# Patient Record
Sex: Female | Born: 1975
Health system: Southern US, Community
[De-identification: ages and names within clinical notes are randomized; demographics above are authoritative.]

## PROBLEM LIST (undated history)

## (undated) DIAGNOSIS — G610 Guillain-Barre syndrome: Secondary | ICD-10-CM

## (undated) DIAGNOSIS — M25559 Pain in unspecified hip: Secondary | ICD-10-CM

## (undated) DIAGNOSIS — E538 Deficiency of other specified B group vitamins: Secondary | ICD-10-CM

## (undated) DIAGNOSIS — E669 Obesity, unspecified: Secondary | ICD-10-CM

## (undated) DIAGNOSIS — M25569 Pain in unspecified knee: Secondary | ICD-10-CM

## (undated) DIAGNOSIS — D539 Nutritional anemia, unspecified: Secondary | ICD-10-CM

## (undated) HISTORY — DX: Pain in unspecified knee: M25.569

## (undated) HISTORY — DX: Pain in unspecified hip: M25.559

## (undated) HISTORY — DX: Obesity, unspecified: E66.9

## (undated) HISTORY — PX: WISDOM TOOTH EXTRACTION: SHX21

---

## 2013-06-21 ENCOUNTER — Emergency Department (INDEPENDENT_AMBULATORY_CARE_PROVIDER_SITE_OTHER)
Admission: EM | Admit: 2013-06-21 | Discharge: 2013-06-21 | Disposition: A | Discharge status: Home / Self Care | Payer: Self-pay | Source: Home / Self Care | Attending: Family Medicine | Admitting: Family Medicine

## 2013-06-21 ENCOUNTER — Encounter (HOSPITAL_COMMUNITY): Payer: Self-pay | Admitting: Emergency Medicine

## 2013-06-21 DIAGNOSIS — L250 Unspecified contact dermatitis due to cosmetics: Secondary | ICD-10-CM

## 2013-06-21 MED ORDER — METHYLPREDNISOLONE ACETATE 80 MG/ML IJ SUSP
INTRAMUSCULAR | Status: AC
  Filled 2013-06-21: qty 1

## 2013-06-21 MED ORDER — TRIAMCINOLONE ACETONIDE 40 MG/ML IJ SUSP
INTRAMUSCULAR | Status: AC
  Filled 2013-06-21: qty 1

## 2013-06-21 MED ORDER — METHYLPREDNISOLONE ACETATE 40 MG/ML IJ SUSP
80.0000 mg | Freq: Once | INTRAMUSCULAR | Status: AC
  Administered 2013-06-21: 80 mg via INTRAMUSCULAR

## 2013-06-21 MED ORDER — TRIAMCINOLONE ACETONIDE 40 MG/ML IJ SUSP
40.0000 mg | Freq: Once | INTRAMUSCULAR | Status: AC
Start: 2013-06-21 — End: 2013-06-21
  Administered 2013-06-21: 40 mg via INTRAMUSCULAR

## 2013-06-21 MED ORDER — FLUTICASONE PROPIONATE 0.05 % EX CREA: TOPICAL_CREAM | Freq: Two times a day (BID) | CUTANEOUS | Status: DC

## 2013-06-21 NOTE — ED Provider Notes (Signed)
CSN: [id number]     Arrival date & time 06/21/13  1800 History   First MD Initiated Contact with Patient 06/21/13 1917     Chief Complaint  Patient presents with  . lips burning    (Consider location/radiation/quality/duration/timing/severity/associated sxs/prior Treatment) Patient is a 38 y.o. female presenting with rash. The history is provided by the patient.  Rash Location:  Face Facial rash location:  Lip Quality: blistering and burning   Severity:  Mild Onset quality:  Gradual Duration:  1 week Progression:  Unchanged Chronicity:  New Relieved by:  None tried Worsened by:  Nothing tried Ineffective treatments:  None tried Associated symptoms: no headaches   Associated symptoms comment:  Burning discomfort in back and legs, no weakness.   History reviewed. No pertinent past medical history. History reviewed. No pertinent past surgical history. No family history on file. History  Substance Use Topics  . Smoking status: Not on file  . Smokeless tobacco: Not on file  . Alcohol Use: Not on file   OB History   Grav Para Term Preterm Abortions TAB SAB Ect Mult Living                 Review of Systems  Constitutional: Negative.   Gastrointestinal: Negative.   Genitourinary: Negative.   Skin: Positive for rash.  Neurological: Negative for facial asymmetry, weakness, numbness and headaches.  Psychiatric/Behavioral: The patient is nervous/anxious.     Allergies  Review of patient's allergies indicates no known allergies.  Home Medications   Current Outpatient Rx  Name  Route  Sig  Dispense  Refill  . fluticasone (CUTIVATE) 0.05 % cream   Topical   Apply topically 2 (two) times daily.   30 g   0    BP 127/83  Pulse 93  Temp(Src) 98.9 F (37.2 C) (Oral)  Resp 16  SpO2 100% Physical Exam  Nursing note and vitals reviewed. Constitutional: She is oriented to person, place, and time. She appears well-developed and well-nourished.  Musculoskeletal: Normal  range of motion.  Neurological: She is alert and oriented to person, place, and time.  Skin: Skin is warm and dry. Rash noted.  Dermatitis of upper and lower lips, lower ext nl nvt fxn.    ED Course  Procedures (including critical care time) Labs Review Labs Reviewed - No data to display Imaging Review No results found.   MDM   1. Contact dermatitis due to cosmetics        Linna HoffJames D Kadra Kohan, MD 06/21/13 [phone]

## 2013-06-21 NOTE — ED Notes (Signed)
Patient states her lips and both lower legs have been  "burning" for the past week States she feels this burning sensation in her lower back as well

## 2013-06-21 NOTE — ED Notes (Signed)
Also complains of sore throat that started yesterday

## 2013-06-21 NOTE — Discharge Instructions (Signed)
Change brands of lipstick , use medicine as prescribed, see your doctor if further problems and for regular care.

## 2013-06-24 ENCOUNTER — Telehealth (HOSPITAL_COMMUNITY): Payer: Self-pay | Admitting: *Deleted

## 2013-06-24 NOTE — ED Notes (Signed)
Pt. called on VM on 3/20 and asked what were the 2 shots she had.  I called back on 3/20 but no answer. Call 1.  I called back today and left a message to call. Call 2.  Pt. called back and verified x 2 and told that she got Depo Medrol and Kenalog, that both were steroids, one was short acting and one is long acting. She said they helped her and she has a f/u appt. Vassie MoselleYork, Macalister Arnaud M 06/24/2013

## 2013-06-25 ENCOUNTER — Emergency Department (HOSPITAL_BASED_OUTPATIENT_CLINIC_OR_DEPARTMENT_OTHER)
Admission: EM | Admit: 2013-06-25 | Discharge: 2013-06-25 | Disposition: A | Discharge status: Home / Self Care | Payer: BC Managed Care – PPO | Attending: Emergency Medicine | Admitting: Emergency Medicine

## 2013-06-25 ENCOUNTER — Emergency Department (HOSPITAL_BASED_OUTPATIENT_CLINIC_OR_DEPARTMENT_OTHER): Payer: BC Managed Care – PPO

## 2013-06-25 ENCOUNTER — Encounter (HOSPITAL_BASED_OUTPATIENT_CLINIC_OR_DEPARTMENT_OTHER): Payer: Self-pay | Admitting: Emergency Medicine

## 2013-06-25 DIAGNOSIS — R0789 Other chest pain: Secondary | ICD-10-CM

## 2013-06-25 DIAGNOSIS — IMO0002 Reserved for concepts with insufficient information to code with codable children: Secondary | ICD-10-CM | POA: Insufficient documentation

## 2013-06-25 DIAGNOSIS — X58XXXA Exposure to other specified factors, initial encounter: Secondary | ICD-10-CM | POA: Insufficient documentation

## 2013-06-25 DIAGNOSIS — R42 Dizziness and giddiness: Secondary | ICD-10-CM | POA: Insufficient documentation

## 2013-06-25 DIAGNOSIS — Y9389 Activity, other specified: Secondary | ICD-10-CM | POA: Insufficient documentation

## 2013-06-25 DIAGNOSIS — Z87891 Personal history of nicotine dependence: Secondary | ICD-10-CM | POA: Insufficient documentation

## 2013-06-25 DIAGNOSIS — Y92410 Unspecified street and highway as the place of occurrence of the external cause: Secondary | ICD-10-CM | POA: Insufficient documentation

## 2013-06-25 DIAGNOSIS — S29011A Strain of muscle and tendon of front wall of thorax, initial encounter: Secondary | ICD-10-CM

## 2013-06-25 DIAGNOSIS — S2341XA Sprain of ribs, initial encounter: Secondary | ICD-10-CM | POA: Insufficient documentation

## 2013-06-25 MED ORDER — HYDROCODONE-ACETAMINOPHEN 5-325 MG PO TABS: 1.0000 | ORAL_TABLET | Freq: Four times a day (QID) | ORAL | Status: DC | PRN

## 2013-06-25 MED ORDER — CYCLOBENZAPRINE HCL 5 MG PO TABS: 5.0000 mg | ORAL_TABLET | Freq: Three times a day (TID) | ORAL | Status: DC | PRN

## 2013-06-25 NOTE — ED Notes (Signed)
Pt transported to radiology in stretcher by tech.

## 2013-06-25 NOTE — ED Provider Notes (Signed)
CSN: [id number]     Arrival date & time 06/25/13  1247 History   First MD Initiated Contact with Patient 06/25/13 1302     Chief Complaint  Patient presents with  . Chest Pain   HPI Rebecca Goodman is 38 y.o. female that presented to the ED with complaints chest wall pain under her left breast, axillary and shoulder while she was driving about an hour ago. She states her left shoulder started to become numb. The pain was a 4/10 when it started. She has never experienced chest pain before. Pain now is 1/10. Patient reports she did feel lightheaded. She denies nausea, vomit or diarrhea or abdominal pain. She doesn't admit to burning in her extremities that seems to worsen as the day proceeds. Patient states that she has stress in her daily life but no more than normal. She's currently on no medications finishing up a steroid pack last week. She has no history of diabetes. No cardiac history. Patient reports Saturday she was lifting a heavy TV on her own.  History reviewed. No pertinent past medical history. History reviewed. No pertinent past surgical history. No family history on file. History  Substance Use Topics  . Smoking status: Former Games developer  . Smokeless tobacco: Not on file  . Alcohol Use: No   OB History   Grav Para Term Preterm Abortions TAB SAB Ect Mult Living                 Review of Systems  Constitutional: Negative for fever, diaphoresis, activity change, appetite change and fatigue.  Respiratory: Negative for cough, chest tightness, shortness of breath and wheezing.   Cardiovascular: Positive for chest pain. Negative for palpitations.  Genitourinary: Negative for flank pain.  Musculoskeletal: Negative for back pain and myalgias.  Skin: Negative for rash.  Neurological: Positive for dizziness. Negative for tremors, seizures, syncope, speech difficulty and weakness.   Allergies  Review of patient's allergies indicates no known allergies.  Home Medications   Current  Outpatient Rx  Name  Route  Sig  Dispense  Refill  . cyclobenzaprine (FLEXERIL) 5 MG tablet   Oral   Take 1 tablet (5 mg total) by mouth 3 (three) times daily as needed for muscle spasms.   15 tablet   0   . fluticasone (CUTIVATE) 0.05 % cream   Topical   Apply topically 2 (two) times daily.   30 g   0   . HYDROcodone-acetaminophen (NORCO) 5-325 MG per tablet   Oral   Take 1 tablet by mouth every 6 (six) hours as needed for moderate pain.   15 tablet   0    BP 131/76  Pulse 78  Temp(Src) 98.6 F (37 C) (Oral)  Resp 18  Ht 5\' 3"  (1.6 m)  Wt 244 lb (110.678 kg)  BMI 43.23 kg/m2  SpO2 100%  LMP 06/11/2013 Physical Exam Gen: NAD. Nontoxic HEENT: AT. [address].  Bilateral eyes without injections or icterus. MMM.  CV: RRR, no murmurs, clicks, gallops or rubs. Chest: CTAB, no wheeze or crackles. Chest wall tenderness left lateral axillary. Abd: Soft. NTND. BS present. No Masses palpated.  Ext: No erythema. No edema.  Skin: No rashes, purpura or petechiae.  Neuro:  Normal gait. PERLA. EOMi. Alert. Grossly intact.  Psych: Normal affect, dress and demeanor. Normal speech.  ED Course  Procedures (including critical care time) Labs Review Labs Reviewed - No data to display Imaging Review Dg Chest 2 View  06/25/2013   CLINICAL DATA:  Chest pain, shortness of Breath  EXAM: CHEST  2 VIEW  COMPARISON:  None.  FINDINGS: Cardiomediastinal silhouette is unremarkable. No acute infiltrate or pleural effusion. No pulmonary edema. Bony thorax is unremarkable.  IMPRESSION: No active cardiopulmonary disease.   Electronically Signed   By: Natasha MeadLiviu  Pop M.D.   On: 06/25/2013 14:06     EKG Interpretation   Date/Time:  Monday June 25 2013 12:52:35 EDT Ventricular Rate:  78 PR Interval:  156 QRS Duration: 78 QT Interval:  366 QTC Calculation: 417 R Axis:   42 Text Interpretation:  Normal sinus rhythm with sinus arrhythmia Normal ECG  Confirmed by DELOS  MD, DOUGLAS ([id number]) on 06/25/2013 1:23:04  PM      MDM   Final diagnoses:  Muscle strain of chest wall  Non-cardiac chest pain   She presented the ED chest wall pain times one hour. Patient had improvement in pain without intervention. Chest x-ray was normal. EKG was normal. Patient's pain likely due from muscle strain or aggravation from lifting heavy objects over the weekend. Patient was given Flexeril and Norco prescription #15. Patient was advised to signed PCP followup. Red flags were discussed with patient on his symptoms worsen to return to ED    Natalia Leatherwoodenee A Kuneff, DO 06/25/13 1504

## 2013-06-25 NOTE — Discharge Instructions (Signed)

## 2013-06-25 NOTE — ED Notes (Signed)
Pt reports has been seen by pcp 3 days ago for 'burning' sensation in bilateral hands. Today was driving and with sudden pain in left lower rib pain and numbness down left arm.  Denies sob, sweating or nausea.  Reports was lightheaded and dizzy.

## 2013-06-25 NOTE — ED Notes (Signed)
Patient changed into gown from waist up. 

## 2013-06-26 ENCOUNTER — Ambulatory Visit (INDEPENDENT_AMBULATORY_CARE_PROVIDER_SITE_OTHER): Payer: Self-pay | Admitting: Family Medicine

## 2013-06-26 VITALS — BP 116/82 | HR 88 | Ht 63.0 in | Wt 244.0 lb

## 2013-06-26 DIAGNOSIS — R202 Paresthesia of skin: Principal | ICD-10-CM

## 2013-06-26 DIAGNOSIS — R200 Anesthesia of skin: Secondary | ICD-10-CM

## 2013-06-26 DIAGNOSIS — R209 Unspecified disturbances of skin sensation: Secondary | ICD-10-CM

## 2013-06-26 NOTE — ED Provider Notes (Signed)
I saw and evaluated the patient, reviewed the resident's note and I agree with the findings and plan. Patient is a 38 year old female who presents with complaints of discomfort to the left upper chest which started approximately one hour prior to presentation. She denies any injury or trauma. She denies any difficulty breathing or recent cough. There is no nausea, diaphoresis. She has no prior cardiac history.  On exam vitals are stable and the patient is afebrile. Head is atraumatic, normocephalic. Heart is regular rate and rhythm and lungs are clear. Palpation of the left upper chest wall reproduces her discomfort. Abdomen is soft, nontender, nondistended. Extremities are without edema.  Workup reveals a normal EKG and chest x-ray is unremarkable. Her symptoms are reproducible with palpation of the chest wall and I strongly doubt any cardiac etiology. She has no risk factors and her symptoms aren atypical for cardiac pain. She will be discharged with pain medication and when necessary followup.   EKG Interpretation   Date/Time:  Monday June 25 2013 12:52:35 EDT Ventricular Rate:  78 PR Interval:  156 QRS Duration: 78 QT Interval:  366 QTC Calculation: 417 R Axis:   42 Text Interpretation:  Normal sinus rhythm with sinus arrhythmia Normal ECG  Confirmed by Malva CoganELOS  MD, Gershon Shorten ([id number]) on 06/25/2013 1:23:04 PM        Geoffery Lyonsouglas Talyn Dessert, MD 06/26/13 [id number]

## 2013-07-02 ENCOUNTER — Encounter: Payer: Self-pay | Admitting: Family Medicine

## 2013-07-02 DIAGNOSIS — R200 Anesthesia of skin: Secondary | ICD-10-CM | POA: Insufficient documentation

## 2013-07-02 DIAGNOSIS — R202 Paresthesia of skin: Principal | ICD-10-CM

## 2013-07-02 NOTE — Progress Notes (Signed)
Patient ID: Rebecca Goodman, female   DOB: 10/20/1975, 38 y.o.   MRN: [id number]  PCP: No PCP Per Patient  Subjective:   HPI: Patient is a 38 y.o. female here for numbness/tingling.  Patient denies known injury. She states on 3/3 she started to develop a burning sensation when at work. Tingling in both her legs and her lips that worsened and by Friday her face and legs were 'on fire' No new medications, soaps, lotions. She went to Urgent care and was given an IM injection of a steroid and topical steroid cream for lips. These have not helped. Initially had similar symptoms in both arms but this has improved. She had some chest tightness and left arm numbness so had workup in ED that was negative. Unfortunately does not have insurance at this time. No speech, vision changes.  History reviewed. No pertinent past medical history.  Current Outpatient Prescriptions on File Prior to Visit  Medication Sig Dispense Refill  . cyclobenzaprine (FLEXERIL) 5 MG tablet Take 1 tablet (5 mg total) by mouth 3 (three) times daily as needed for muscle spasms.  15 tablet  0  . fluticasone (CUTIVATE) 0.05 % cream Apply topically 2 (two) times daily.  30 g  0  . HYDROcodone-acetaminophen (NORCO) 5-325 MG per tablet Take 1 tablet by mouth every 6 (six) hours as needed for moderate pain.  15 tablet  0   No current facility-administered medications on file prior to visit.    History reviewed. No pertinent past surgical history.  No Known Allergies  History   Social History  . Marital Status: Single    Spouse Name: N/A    Number of Children: N/A  . Years of Education: N/A   Occupational History  . Not on file.   Social History Main Topics  . Smoking status: Former Games developermoker  . Smokeless tobacco: Not on file  . Alcohol Use: No  . Drug Use: No  . Sexual Activity: Not on file   Other Topics Concern  . Not on file   Social History Narrative  . No narrative on file    Family History  Problem  Relation Age of Onset  . Hypertension Father   . Sudden death Neg Hx   . Diabetes Neg Hx   . Heart attack Neg Hx   . Hyperlipidemia Neg Hx     BP 116/82  Pulse 88  Ht 5\' 3"  (1.6 m)  Wt 244 lb (110.678 kg)  BMI 43.23 kg/m2  LMP 06/11/2013  Review of Systems: See HPI above.    Objective:  Physical Exam:  Gen: NAD  Neck: No gross deformity, swelling, bruising. No paraspinal TTP .  No midline/bony TTP. FROM neck without pain . BUE strength 5/5.   Sensation intact to light touch.   2+ equal reflexes in triceps, biceps, brachioradialis tendons. Negative spurlings. NV intact distal BUEs.  Back: No gross deformity, scoliosis. No TTP.  No midline or bony TTP. FROM. Strength LEs 5/5 all muscle groups.  2+ MSRs in patellar and achilles tendons, equal bilaterally. Negative SLRs. Sensation intact to light touch bilaterally. Negative logroll bilateral hips    Assessment & Plan:  1. Numbness/Burning in extremities and lips - I do not believe this is an orthopedic/sports medicine issue.  Not consistent with an urgent issue like a stroke but she needs to see neurology for evaluation.  Was given [employer] coverage paperwork to fill out and call us as soon as this goes through so we can do  neurology referral.

## 2013-07-02 NOTE — Assessment & Plan Note (Signed)
I do not believe this is an orthopedic/sports medicine issue. Not consistent with an urgent issue like a stroke but she needs to see neurology for evaluation. Was given [employer] coverage paperwork to fill out and call us as soon as this goes through so we can do neurology referral.

## 2013-07-16 ENCOUNTER — Emergency Department (HOSPITAL_BASED_OUTPATIENT_CLINIC_OR_DEPARTMENT_OTHER): Payer: BC Managed Care – PPO

## 2013-07-16 ENCOUNTER — Encounter (HOSPITAL_BASED_OUTPATIENT_CLINIC_OR_DEPARTMENT_OTHER): Payer: Self-pay | Admitting: Emergency Medicine

## 2013-07-16 ENCOUNTER — Emergency Department (HOSPITAL_BASED_OUTPATIENT_CLINIC_OR_DEPARTMENT_OTHER)
Admission: EM | Admit: 2013-07-16 | Discharge: 2013-07-16 | Disposition: A | Discharge status: Home / Self Care | Payer: BC Managed Care – PPO | Attending: Emergency Medicine | Admitting: Emergency Medicine

## 2013-07-16 DIAGNOSIS — M549 Dorsalgia, unspecified: Secondary | ICD-10-CM

## 2013-07-16 DIAGNOSIS — Z87891 Personal history of nicotine dependence: Secondary | ICD-10-CM | POA: Insufficient documentation

## 2013-07-16 DIAGNOSIS — Z3202 Encounter for pregnancy test, result negative: Secondary | ICD-10-CM | POA: Insufficient documentation

## 2013-07-16 DIAGNOSIS — IMO0002 Reserved for concepts with insufficient information to code with codable children: Secondary | ICD-10-CM | POA: Insufficient documentation

## 2013-07-16 DIAGNOSIS — M5410 Radiculopathy, site unspecified: Secondary | ICD-10-CM

## 2013-07-16 LAB — URINALYSIS, ROUTINE W REFLEX MICROSCOPIC
Bilirubin Urine: NEGATIVE
GLUCOSE, UA: NEGATIVE mg/dL
Hgb urine dipstick: NEGATIVE
Ketones, ur: NEGATIVE mg/dL
LEUKOCYTES UA: NEGATIVE
Nitrite: NEGATIVE
Protein, ur: NEGATIVE mg/dL
Specific Gravity, Urine: 1.005 (ref 1.005–1.030)
Urobilinogen, UA: 0.2 mg/dL (ref 0.0–1.0)
pH: 6 (ref 5.0–8.0)

## 2013-07-16 LAB — PREGNANCY, URINE: PREG TEST UR: NEGATIVE

## 2013-07-16 MED ORDER — PREDNISONE 10 MG PO TABS: 20.0000 mg | ORAL_TABLET | Freq: Every day | ORAL | Status: DC

## 2013-07-16 MED ORDER — PREDNISONE 50 MG PO TABS
60.0000 mg | ORAL_TABLET | Freq: Once | ORAL | Status: DC
  Filled 2013-07-16 (×2): qty 1

## 2013-07-16 MED ORDER — CYCLOBENZAPRINE HCL 10 MG PO TABS: 10.0000 mg | ORAL_TABLET | Freq: Three times a day (TID) | ORAL | Status: DC | PRN

## 2013-07-16 NOTE — Discharge Instructions (Signed)
Radicular Pain Radicular pain in either the arm or leg is usually from a bulging or herniated disk in the spine. A piece of the herniated disk may press against the nerves as the nerves exit the spine. This causes pain which is felt at the tips of the nerves down the arm or leg. Other causes of radicular pain may include:  Fractures.  Heart disease.  Cancer.  An abnormal and usually degenerative state of the nervous system or nerves (neuropathy). Diagnosis may require CT or MRI scanning to determine the primary cause.  Nerves that start at the neck (nerve roots) may cause radicular pain in the outer shoulder and arm. It can spread down to the thumb and fingers. The symptoms vary depending on which nerve root has been affected. In most cases radicular pain improves with conservative treatment. Neck problems may require physical therapy, a neck collar, or cervical traction. Treatment may take many weeks, and surgery may be considered if the symptoms do not improve.  Conservative treatment is also recommended for sciatica. Sciatica causes pain to radiate from the lower back or buttock area down the leg into the foot. Often there is a history of back problems. Most patients with sciatica are better after 2 to 4 weeks of rest and other supportive care. Short term bed rest can reduce the disk pressure considerably. Sitting, however, is not a good position since this increases the pressure on the disk. You should avoid bending, lifting, and all other activities which make the problem worse. Traction can be used in severe cases. Surgery is usually reserved for patients who do not improve within the first months of treatment. Only take over-the-counter or prescription medicines for pain, discomfort, or fever as directed by your caregiver. Narcotics and muscle relaxants may help by relieving more severe pain and spasm and by providing mild sedation. Cold or massage can give significant relief. Spinal manipulation  is not recommended. It can increase the degree of disc protrusion. Epidural steroid injections are often effective treatment for radicular pain. These injections deliver medicine to the spinal nerve in the space between the protective covering of the spinal cord and back bones (vertebrae). Your caregiver can give you more information about steroid injections. These injections are most effective when given within two weeks of the onset of pain.  You should see your caregiver for follow up care as recommended. A program for neck and back injury rehabilitation with stretching and strengthening exercises is an important part of management.  SEEK IMMEDIATE MEDICAL CARE IF:  You develop increased pain, weakness, or numbness in your arm or leg.  You develop difficulty with bladder or bowel control.  You develop abdominal pain. Document Released: 04/29/2004 Document Revised: 06/14/2011 Document Reviewed: 07/15/2008 [employer] Patient Information 2014 [employer].  

## 2013-07-16 NOTE — ED Notes (Signed)
Patient states she developed bilateral leg pain three days ago.  States she developed lower back pain three weeks ago.  States she has pain in the bilateral calf pain, left > right.  States the left leg hurts over the entire leg, and the right leg from the posterior knee to the foot.  States her feet feel numb.  Denies any kind of injury.  States she had an IUD to spontaneously come out two days after her back started to hurt and feels that that is the reason for her pain.

## 2013-07-16 NOTE — ED Provider Notes (Addendum)
CSN: [id number]     Arrival date & time 07/16/13  0849 History   First MD Initiated Contact with Patient 07/16/13 [phone]     Chief Complaint  Patient presents with  . Leg Pain     (Consider location/radiation/quality/duration/timing/severity/associated sxs/prior Treatment) HPI  Patient presents today complaining of pain in her left medial eye that began several days ago. She states it occurs with standing. She is also experienced some low back pain. She has over the past 6 weeks had an episode where she felt numb from her knees down to her feet bilaterally with the perioral paresthesias. She was seen for this at urgent care and thought to have an allergic reaction. She states after this began she began having some back pain. This resolved spontaneously. She had an episode where she was concerned that she might have contracted an STD and was seen at the [facility] department in early March and reports that she was tested for STDs and has not had any discharge or problems since then. She was also seen for an episode of left chest pain and shoulder pain that was felt to be noncardiac in origin. The intermittent numbness from the knees down has waxed and waned. It has no exacerbating or relieving factors. She has not noted any weakness in either foot, knee, or hip. She has not had any loss of bowel or bladder control. She has had no known trauma. She had an IUD which came out spontaneously during the past 4 weeks.  During the past 6 weeks, when these things have occurred, she has established care with a primary care physician. She was seen and had her thyroid screen and had an ultrasound of her thyroid that was reported to her as normal. She states that she hasn't told her symptoms and is concerned that the left leg may have some more swelling than the right and that she has noted some varicose veins.  History reviewed. No pertinent past medical history. History reviewed. No pertinent past surgical  history. Family History  Problem Relation Age of Onset  . Hypertension Father   . Sudden death Neg Hx   . Diabetes Neg Hx   . Heart attack Neg Hx   . Hyperlipidemia Neg Hx    History  Substance Use Topics  . Smoking status: Former Games developermoker  . Smokeless tobacco: Not on file  . Alcohol Use: No   OB History   Grav Para Term Preterm Abortions TAB SAB Ect Mult Living                 Review of Systems  Constitutional: Negative.   HENT: Negative.   Eyes: Negative.   Respiratory: Negative.   Cardiovascular: Negative.   Gastrointestinal: Negative.   Endocrine: Negative.   Genitourinary: Negative.   Musculoskeletal: Positive for back pain.  Skin: Negative.   Neurological: Negative.   Hematological: Negative.   Psychiatric/Behavioral: Negative.   All other systems reviewed and are negative.     Allergies  Review of patient's allergies indicates no known allergies.  Home Medications   Current Outpatient Rx  Name  Route  Sig  Dispense  Refill  . amoxicillin (AMOXIL) 250 MG capsule   Oral   Take 250 mg by mouth 3 (three) times daily.         . cyclobenzaprine (FLEXERIL) 5 MG tablet   Oral   Take 1 tablet (5 mg total) by mouth 3 (three) times daily as needed for muscle spasms.   15  tablet   0   . fluticasone (CUTIVATE) 0.05 % cream   Topical   Apply topically 2 (two) times daily.   30 g   0   . HYDROcodone-acetaminophen (NORCO) 5-325 MG per tablet   Oral   Take 1 tablet by mouth every 6 (six) hours as needed for moderate pain.   15 tablet   0    BP 155/81  Pulse 94  Temp(Src) 98.4 F (36.9 C) (Oral)  Resp 18  Ht 5\' 3"  (1.6 m)  Wt 240 lb (108.863 kg)  BMI 42.52 kg/m2  SpO2 98%  LMP 06/30/2013 Physical Exam  Nursing note and vitals reviewed. Constitutional: She is oriented to person, place, and time. She appears well-developed and well-nourished.  HENT:  Head: Normocephalic and atraumatic.  Right Ear: Tympanic membrane and external ear normal.  Left  Ear: Tympanic membrane and external ear normal.  Nose: Nose normal. Right sinus exhibits no maxillary sinus tenderness and no frontal sinus tenderness. Left sinus exhibits no maxillary sinus tenderness and no frontal sinus tenderness.  Eyes: Conjunctivae and EOM are normal. Pupils are equal, round, and reactive to light. Right eye exhibits no nystagmus. Left eye exhibits no nystagmus.  Neck: Normal range of motion. Neck supple.  Cardiovascular: Normal rate, regular rhythm, normal heart sounds and intact distal pulses.   Pulmonary/Chest: Effort normal and breath sounds normal. No respiratory distress. She exhibits no tenderness.  Abdominal: Soft. Bowel sounds are normal. She exhibits no distension and no mass. There is no tenderness.  Musculoskeletal: Normal range of motion. She exhibits no edema and no tenderness.  Neurological: She is alert and oriented to person, place, and time. She has normal strength and normal reflexes. No sensory deficit. She displays a negative Romberg sign. GCS eye subscore is 4. GCS verbal subscore is 5. GCS motor subscore is 6.  Reflex Scores:      Tricep reflexes are 2+ on the right side and 2+ on the left side.      Bicep reflexes are 2+ on the right side and 2+ on the left side.      Brachioradialis reflexes are 2+ on the right side and 2+ on the left side.      Patellar reflexes are 2+ on the right side and 2+ on the left side.      Achilles reflexes are 2+ on the right side and 2+ on the left side. Patient with normal gait without ataxia, shuffling, spasm, or antalgia. Speech is normal without dysarthria, dysphasia, or aphasia. Muscle strength is 5/5 in bilateral shoulders, elbow flexor and extensors, wrist flexor and extensors, and intrinsic hand muscles. Sensation normal bilateral lower extremities to sharp and light touch.  5/5 bilateral lower extremity hip flexors, extensors, knee flexors and extensors, and ankle dorsi and plantar flexors.    Skin: Skin is  warm and dry. No rash noted.  Psychiatric: She has a normal mood and affect. Her behavior is normal. Judgment and thought content normal.    ED Course  Procedures (including critical care time) Labs Review Labs Reviewed  URINALYSIS, ROUTINE W REFLEX MICROSCOPIC  PREGNANCY, URINE   Imaging Review US Venous Img Lower Bilateral  07/16/2013   CLINICAL DATA:  Three day history of bilateral lower leg and back pain.  EXAM: BILATERAL LOWER EXTREMITY VENOUS DOPPLER ULTRASOUND  TECHNIQUE: Gray-scale sonography with graded compression, as well as color Doppler and duplex ultrasound were performed to evaluate the lower extremity deep venous systems from the level of the common  femoral vein and including the common femoral, femoral, profunda femoral, popliteal and calf veins including the posterior tibial, peroneal and gastrocnemius veins when visible. The superficial great saphenous vein was also interrogated. Spectral Doppler was utilized to evaluate flow at rest and with distal augmentation maneuvers in the common femoral, femoral and popliteal veins.  COMPARISON:  None.  FINDINGS: RIGHT LOWER EXTREMITY  Common Femoral Vein: No evidence of thrombus. Normal compressibility, respiratory phasicity and response to augmentation.  Saphenofemoral Junction: No evidence of thrombus. Normal compressibility and flow on color Doppler imaging.  Profunda Femoral Vein: No evidence of thrombus. Normal compressibility and flow on color Doppler imaging.  Femoral Vein: No evidence of thrombus. Normal compressibility, respiratory phasicity and response to augmentation.  Popliteal Vein: No evidence of thrombus. Normal compressibility, respiratory phasicity and response to augmentation.  Calf Veins: No evidence of thrombus. Normal compressibility and flow on color Doppler imaging.  Superficial Great Saphenous Vein: No evidence of thrombus. Normal compressibility and flow on color Doppler imaging.  Venous Reflux:  None.  Other  Findings:  None.  LEFT LOWER EXTREMITY  Common Femoral Vein: No evidence of thrombus. Normal compressibility, respiratory phasicity and response to augmentation.  Saphenofemoral Junction: No evidence of thrombus. Normal compressibility and flow on color Doppler imaging.  Profunda Femoral Vein: No evidence of thrombus. Normal compressibility and flow on color Doppler imaging.  Femoral Vein: No evidence of thrombus. Normal compressibility, respiratory phasicity and response to augmentation.  Popliteal Vein: No evidence of thrombus. Normal compressibility, respiratory phasicity and response to augmentation.  Calf Veins: No evidence of thrombus. Normal compressibility and flow on color Doppler imaging.  Superficial Great Saphenous Vein: No evidence of thrombus. Normal compressibility and flow on color Doppler imaging.  Venous Reflux:  None.  Other Findings:  None.  IMPRESSION: No evidence of deep venous thrombosis.   Electronically Signed   By: Malachy MoanHeath  McCullough M.D.   On: 07/16/2013 11:21     EKG Interpretation None      MDM   Final diagnoses:  Back pain  Radiculopathy   Patient with multiple buried symptoms with chief complaint today being pain in the left medial thigh and back pain. I suspect that this could be a lumbar radiculopathy. She has a normal neurologic exam. I have discussed with her whether or not to have an MRI. She currently does not have health care insurance and does not wish to proceed with an MRI at this time. I feel that given the normal neurological exam and the lack of progression of the paresthesias over the previous 6 weeks, that this is a reasonable choice at this time. An ultrasound is performed of her lower extremities due to concerns of DVT and sedentary job.The us is normal bilaterally and plan referral to neurology for follow up.  Today's back pain and left leg pain c.w. l2 l3 radiculopathy- patient given rx for prednisone and flexeril and advised f/u.      Hilario Quarryanielle S  Taneesha Edgin, MD 07/16/13 1141  Hilario Quarryanielle S Jenny Omdahl, MD 07/16/13 1146

## 2013-08-17 ENCOUNTER — Other Ambulatory Visit: Payer: Self-pay | Admitting: Obstetrics & Gynecology

## 2013-08-17 ENCOUNTER — Other Ambulatory Visit (HOSPITAL_COMMUNITY)
Admission: RE | Admit: 2013-08-17 | Discharge: 2013-08-17 | Disposition: A | Discharge status: Home / Self Care | Payer: BC Managed Care – PPO | Source: Ambulatory Visit | Attending: Obstetrics & Gynecology | Admitting: Obstetrics & Gynecology

## 2013-08-17 DIAGNOSIS — Z01419 Encounter for gynecological examination (general) (routine) without abnormal findings: Secondary | ICD-10-CM | POA: Insufficient documentation

## 2013-08-17 DIAGNOSIS — Z1151 Encounter for screening for human papillomavirus (HPV): Secondary | ICD-10-CM | POA: Insufficient documentation

## 2013-09-06 ENCOUNTER — Telehealth: Payer: Self-pay | Admitting: Medical

## 2013-09-06 ENCOUNTER — Ambulatory Visit (INDEPENDENT_AMBULATORY_CARE_PROVIDER_SITE_OTHER): Payer: BC Managed Care – PPO | Admitting: Medical

## 2013-09-06 ENCOUNTER — Encounter: Payer: Self-pay | Admitting: Medical

## 2013-09-06 VITALS — BP 120/80 | HR 80 | Temp 98.3°F | Resp 16 | Ht 62.2 in | Wt 247.0 lb

## 2013-09-06 DIAGNOSIS — K130 Diseases of lips: Secondary | ICD-10-CM

## 2013-09-06 DIAGNOSIS — IMO0002 Reserved for concepts with insufficient information to code with codable children: Secondary | ICD-10-CM

## 2013-09-06 DIAGNOSIS — M5410 Radiculopathy, site unspecified: Secondary | ICD-10-CM

## 2013-09-06 DIAGNOSIS — R220 Localized swelling, mass and lump, head: Secondary | ICD-10-CM

## 2013-09-06 DIAGNOSIS — E669 Obesity, unspecified: Secondary | ICD-10-CM

## 2013-09-06 DIAGNOSIS — M549 Dorsalgia, unspecified: Secondary | ICD-10-CM

## 2013-09-06 MED ORDER — CYCLOBENZAPRINE HCL 10 MG PO TABS: ORAL_TABLET | ORAL | Status: DC

## 2013-09-06 MED ORDER — NAPROXEN 375 MG PO TABS: 375.0000 mg | ORAL_TABLET | Freq: Two times a day (BID) | ORAL | Status: DC

## 2013-09-06 MED ORDER — AMOXICILLIN 875 MG PO TABS: 875.0000 mg | ORAL_TABLET | Freq: Two times a day (BID) | ORAL | Status: DC

## 2013-09-06 NOTE — Telephone Encounter (Signed)
i FAX OVER ALL OF INFORMATION TO [facility] AND THEY WILL CONTACT HER ABOUT HER APPOINTMENT. CLS

## 2013-09-06 NOTE — Patient Instructions (Signed)
  Thank you for giving me the opportunity to serve you today.    Your diagnosis today includes: Encounter Diagnoses  Name Primary?  . Back pain Yes  . Radicular pain of lower extremity   . Lip swelling      Specific recommendations today include:  I recommend you continue efforts to lose weight through healthy low fat diet and exercise  We will refer you to physical therapy for back pain/leg pain  You may use Naprosyn 1-2 times daily for pain (prescription)  You may use Flexeril muscle relaxer, 1/2-1 tablet at bedtime for spasm/pain as needed (prescription).  Caution - this may cause drowsiness  Begin amoxicillin for lip swelling, but follow up with dentist for hygiene  Return refer to PT, f/u 95mo.

## 2013-09-06 NOTE — Progress Notes (Signed)
Subjective:   Rebecca Goodman is a 38 y.o. female presenting on 09/06/2013 with Back Pain, BILATERAL LEG AND KNEE PAIN and LIPS SWOLLEN AND BURNING'  She reports back and leg pain x 3 months.  Has gotten a little better on and off, but lately is staying painful.  Denies specific trauma or fall, but does recall moving a old style heavy tv using carpet to slide the tv.  A week later back started hurting, missed a week of work due to pain.  Prior to 25mo ago, hasn't had back, leg, knee problems.  Since last year lost over 25lb, been working to exercise and eat healthy, trying to lose weight.  Currently not exercising due to pain since 17mo.  Today has pain in low back, back of bilat thighs has burning pain.  If standing for long time, legs feel weak.  Similarly if standing for long periods, legs get to feeling a little numb.  At one point in early May went to the ED for this pain as it was severe.  Ruled out DVT, called this back strain.  Using Ibuprofen OTC some.   Works in a seated job all day.  Sitting for long times gets burning pain down legs too.  No other aggravating or relieving factors.   Also has issue with lip swelling and tingling the last few months intermittent, saw urgent care a few times for this, had steroid shots which made her anxiety.  She has tried elimination diet and elimination of hygiene products, but can't seem to find a solution.  The only thing that has helped was amoxicillin per dentist.    She had a tooth pulled and is due back vfor cleaning.  Just recently started dental care routinely.   No other complaint.  Review of Systems ROS as in subjective      Objective:    Filed Vitals:   09/06/13 0824  BP: 120/80  Pulse: 80  Temp: 98.3 F (36.8 C)  Resp: 16    General appearance: alert, no distress, WD/WN HEENT: normocephalic, sclerae anicteric, TMs pearly, nares patent, no discharge or erythema, pharynx normal Oral cavity: MMM, no lesions, +gingivitis Neck: supple,  no lymphadenopathy, no thyromegaly, no masses Heart: RRR, normal S1, S2, no murmurs Lungs: CTA bilaterally, no wheezes, rhonchi, or rales Back: paraspinal bilat  Lumbar tenderness, normal ROM, otherwise nontender, no obvious scoliosis MSK: hips nontender, normal ROM, knees with mild patellar and mild joint line tenderness medially of bilat knees, otherwise nontneder, normal ROM, no swelling, rest of LE unremarkable Neuro: nonfocal exam, normal strength, sensation and DTRs of LE, normal heel and toe walk, -SLR Pulses: 2+ symmetric, upper and lower extremities, normal cap refill Ext: no edema      Assessment: Encounter Diagnoses  Name Primary?  . Back pain Yes  . Radicular pain of lower extremity   . Lip swelling   . Obesity, unspecified      Plan: Back pain, radicular pain, obesity  - likely related to obesity, musculoskeletal.  Refer to PT, scripts below for naprosyn, flexeril prn, work on weight loss efforts.    Lip swelling history (normal lip/facial exam today) - likely due to gingival disease.  Rx - amoxicillin, f/u with dentist  Rebecca Goodman was seen today for back pain, bilateral leg and knee pain and lips swollen and burning'.  Diagnoses and associated orders for this visit:  Back pain  Radicular pain of lower extremity  Lip swelling  Obesity, unspecified  Other Orders -  cyclobenzaprine (FLEXERIL) 10 MG tablet; 1/2-1 tablet po QHS - naproxen (NAPROSYN) 375 MG tablet; Take 1 tablet (375 mg total) by mouth 2 (two) times daily with a meal. - amoxicillin (AMOXIL) 875 MG tablet; Take 1 tablet (875 mg total) by mouth 2 (two) times daily.    Return refer to PT, f/u 22mo.

## 2013-09-06 NOTE — Telephone Encounter (Signed)
Refer to PT for back pain, buttock and thigh pain/radicular pain

## 2013-10-11 ENCOUNTER — Ambulatory Visit: Payer: BC Managed Care – PPO | Admitting: Medical

## 2013-10-29 ENCOUNTER — Ambulatory Visit (INDEPENDENT_AMBULATORY_CARE_PROVIDER_SITE_OTHER): Payer: BC Managed Care – PPO | Admitting: Medical

## 2013-10-29 ENCOUNTER — Encounter: Payer: Self-pay | Admitting: Medical

## 2013-10-29 VITALS — BP 130/78 | HR 63 | Temp 97.5°F | Resp 16 | Ht 63.0 in | Wt 253.0 lb

## 2013-10-29 DIAGNOSIS — M545 Low back pain, unspecified: Secondary | ICD-10-CM

## 2013-10-29 DIAGNOSIS — K130 Diseases of lips: Secondary | ICD-10-CM

## 2013-10-29 DIAGNOSIS — Z0000 Encounter for general adult medical examination without abnormal findings: Secondary | ICD-10-CM

## 2013-10-29 DIAGNOSIS — E669 Obesity, unspecified: Secondary | ICD-10-CM

## 2013-10-29 DIAGNOSIS — Z23 Encounter for immunization: Secondary | ICD-10-CM

## 2013-10-29 DIAGNOSIS — R220 Localized swelling, mass and lump, head: Secondary | ICD-10-CM

## 2013-10-29 LAB — POCT URINALYSIS DIPSTICK
Bilirubin, UA: NEGATIVE
Blood, UA: NEGATIVE
GLUCOSE UA: NEGATIVE
Ketones, UA: NEGATIVE
Leukocytes, UA: NEGATIVE
NITRITE UA: NEGATIVE
Protein, UA: NEGATIVE
Spec Grav, UA: <=1.005
Urobilinogen, UA: NEGATIVE
pH, UA: 5

## 2013-10-29 LAB — CBC
HCT: 38.3 % (ref 36.0–46.0)
HEMOGLOBIN: 12.7 g/dL (ref 12.0–15.0)
MCH: 30 pg (ref 26.0–34.0)
MCHC: 33.2 g/dL (ref 30.0–36.0)
MCV: 90.5 fL (ref 78.0–100.0)
Platelets: 193 10*3/uL (ref 150–400)
RBC: 4.23 MIL/uL (ref 3.87–5.11)
RDW: 13.2 % (ref 11.5–15.5)
WBC: 4.4 10*3/uL (ref 4.0–10.5)

## 2013-10-29 NOTE — Addendum Note (Signed)
Addended by: Janeice RobinsonSCALES, Ajanae Virag L on: 10/29/2013 10:35 AM   Modules accepted: Orders

## 2013-10-29 NOTE — Progress Notes (Signed)
Subjective:   HPI  Rebecca Goodman is a 38 y.o. female who presents for a complete physical.  Recently established here as new patient.   Preventative care: Last ophthalmology visit: n/a Last dental visit:yes [facility] Last colonoscopy:n/a Last mammogram:n/a Last gynecological exam:08/2013, [facility], gyn Last EKG:n/a Last labs: ?  Prior vaccinations: TD or Tdap: no Influenza: n/a Pneumococcal:n/a Shingles/Zostavax:n/a  Advanced directive:n/a Health care power of attorney:n/a Living will:n/a  Concerns: Back pain from last visit improved, but still an issue.  Working to exercise more regularly.   Never saw PT, canceled the appt due to insurance change.   Still having lip itching, irritation, at times swelling.  This has been going on for several months. Has done her own trial and error using/eliminating various hygiene lip products, has cut out several foods for trial and error to see if it was the cause, has used multiple antihistamines, no improvement.   Reviewed their medical, surgical, family, social, medication, and allergy history and updated chart as appropriate.  Past Medical History  Diagnosis Date  . Arthralgia of knee   . Arthralgia of hip   . Obesity     Past Surgical History  Procedure Laterality Date  . Wisdom tooth extraction      History   Social History  . Marital Status: Single    Spouse Name: N/A    Number of Children: N/A  . Years of Education: N/A   Occupational History  . Not on file.   Social History Main Topics  . Smoking status: Former Smoker    Quit date: 08/29/2013  . Smokeless tobacco: Not on file  . Alcohol Use: 1.2 oz/week    1 Glasses of wine, 1 Shots of liquor per week  . Drug Use: No  . Sexual Activity: No   Other Topics Concern  . Not on file   Social History Narrative   Daughter lives with her.   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   Exercise - walking, stretching, aerobics.      Family History   Problem Relation Age of Onset  . Hypertension Father   . Stroke Father [id number]  . Sudden death Neg Hx   . Heart attack Neg Hx   . Hyperlipidemia Neg Hx   . Cancer Neg Hx   . Heart disease Neg Hx   . Diabetes Mother     borderline  . Diabetes Maternal Aunt   . Diabetes Maternal Grandmother   . Diabetes Maternal Aunt     Current outpatient prescriptions:levonorgestrel-ethinyl estradiol (AVIANE,ALESSE,LESSINA) 0.1-20 MG-MCG tablet, Take 1 tablet by mouth daily., Disp: , Rfl:   Allergies  Allergen Reactions  . Prednisone     IM, bad anxiety     Review of Systems Constitutional: -fever, -chills, -sweats, -unexpected weight change, -decreased appetite, -fatigue Allergy: -sneezing, -itching, -congestion Dermatology: -changing moles, --rash, -lumps ENT: -runny nose, -ear pain, -sore throat, -hoarseness, -sinus pain, -teeth pain, - ringing in ears, -hearing loss, -nosebleeds Cardiology: -chest pain, -palpitations, -swelling, -difficulty breathing when lying flat, -waking up short of breath Respiratory: -cough, -shortness of breath, -difficulty breathing with exercise or exertion, -wheezing, -coughing up blood Gastroenterology: -abdominal pain, -nausea, -vomiting, -diarrhea, -constipation, -blood in stool, -changes in bowel movement, -difficulty swallowing or eating Hematology: -bleeding, -bruising , + lips burning Musculoskeletal: -joint aches, -muscle aches, +joint swelling, +back pain, -neck pain, -cramping, -changes in gait Ophthalmology: denies vision changes, eye redness, itching, discharge Urology: -burning with urination, -difficulty urinating, -blood in urine, -urinary frequency, -urgency, -  incontinence Neurology: -headache, -weakness, -tingling, -numbness, -memory loss, -falls, -dizziness Psychology: -depressed mood, -agitation, -sleep problems     Objective:   Physical Exam  BP 130/78  Pulse 63  Temp(Src) 97.5 F (36.4 C) (Oral)  Resp 16  Ht 5\' 3"  (1.6 m)  Wt 253 lb  (114.76 kg)  BMI 44.83 kg/m2  LMP 10/10/2013 BP Readings from Last 3 Encounters:  10/29/13 130/78  09/06/13 120/80  07/16/13 155/81   Wt Readings from Last 3 Encounters:  10/29/13 253 lb (114.76 kg)  09/06/13 247 lb (112.038 kg)  07/16/13 240 lb (108.863 kg)    General appearance: alert, no distress, WD/WN, obese AA female Skin: few scattered benign macules, no worrisome lesions HEENT: normocephalic, conjunctiva/corneas normal, sclerae anicteric, PERRLA, EOMi, nares patent, no discharge or erythema, pharynx normal Oral cavity: MMM, tongue normal, teeth - moderate stain, plaque, no obvious decay Neck: supple, no lymphadenopathy, no thyromegaly, no masses, normal ROM, no bruits Chest: non tender, normal shape and expansion Heart: RRR, normal S1, S2, no murmurs Lungs: CTA bilaterally, no wheezes, rhonchi, or rales Abdomen: +bs, soft, non tender, non distended, no masses, no hepatomegaly, no splenomegaly, no bruits Back: non tender, normal ROM, no scoliosis Musculoskeletal: upper extremities non tender, no obvious deformity, normal ROM throughout, lower extremities non tender, no obvious deformity, normal ROM throughout Extremities: no edema, no cyanosis, no clubbing Pulses: 2+ symmetric, upper and lower extremities, normal cap refill Neurological: alert, oriented x 3, CN2-12 intact, strength normal upper extremities and lower extremities, sensation normal throughout, DTRs 2+ throughout, no cerebellar signs, gait normal Psychiatric: normal affect, behavior normal, pleasant  Breast/gyn/rectal - deferred to gynecology   Assessment and Plan :    Encounter Diagnoses  Name Primary?  . Routine general medical examination at a health care facility Yes  . Obesity, unspecified   . Lip swelling   . Bilateral low back pain without sciatica   . Need for Tdap vaccination     Physical exam - discussed healthy lifestyle, diet, exercise, preventative care, vaccinations, and addressed their  concerns.  Handout given.  Get yearly flu vaccine.  Obesity - advised weight loss through heathy diet and exercise changes.  Lip swelling - go for dental hygiene visit. If no visit after this, consider allergist referral.  Back pain - c/t weight loss efforts, routine stretching and exercise  Counseled on the Tdap (tetanus, diptheria, and acellular pertussis) vaccine.  Vaccine information sheet given. Tdap vaccine given after consent obtained.  Follow-up pending labs

## 2013-10-30 LAB — COMPREHENSIVE METABOLIC PANEL
ALBUMIN: 4.3 g/dL (ref 3.5–5.2)
ALK PHOS: 43 U/L (ref 39–117)
ALT: 11 U/L (ref 0–35)
AST: 13 U/L (ref 0–37)
BUN: 12 mg/dL (ref 6–23)
CO2: 23 mEq/L (ref 19–32)
Calcium: 9.3 mg/dL (ref 8.4–10.5)
Chloride: 105 mEq/L (ref 96–112)
Creat: 1.07 mg/dL (ref 0.50–1.10)
Glucose, Bld: 85 mg/dL (ref 70–99)
POTASSIUM: 3.9 meq/L (ref 3.5–5.3)
SODIUM: 137 meq/L (ref 135–145)
TOTAL PROTEIN: 7.5 g/dL (ref 6.0–8.3)
Total Bilirubin: 0.4 mg/dL (ref 0.2–1.2)

## 2013-10-30 LAB — LIPID PANEL
Cholesterol: 133 mg/dL (ref 0–200)
HDL: 63 mg/dL (ref >39)
LDL CALC: 57 mg/dL (ref 0–99)
Total CHOL/HDL Ratio: 2.1 Ratio
Triglycerides: 66 mg/dL (ref <150)
VLDL: 13 mg/dL (ref 0–40)

## 2013-10-30 LAB — TSH: TSH: 0.808 u[IU]/mL (ref 0.350–4.500)

## 2016-05-17 ENCOUNTER — Other Ambulatory Visit (HOSPITAL_COMMUNITY)
Admission: RE | Admit: 2016-05-17 | Discharge: 2016-05-17 | Disposition: A | Discharge status: Home / Self Care | Payer: BLUE CROSS/BLUE SHIELD | Source: Ambulatory Visit | Attending: Obstetrics and Gynecology | Admitting: Obstetrics and Gynecology

## 2016-05-17 ENCOUNTER — Other Ambulatory Visit: Payer: Self-pay | Admitting: Obstetrics & Gynecology

## 2016-05-17 DIAGNOSIS — Z01419 Encounter for gynecological examination (general) (routine) without abnormal findings: Secondary | ICD-10-CM | POA: Insufficient documentation

## 2016-05-17 DIAGNOSIS — Z1231 Encounter for screening mammogram for malignant neoplasm of breast: Secondary | ICD-10-CM

## 2016-05-18 LAB — CYTOLOGY - PAP: DIAGNOSIS: NEGATIVE

## 2016-06-01 ENCOUNTER — Ambulatory Visit: Payer: Self-pay

## 2016-06-15 ENCOUNTER — Ambulatory Visit
Admission: RE | Admit: 2016-06-15 | Discharge: 2016-06-15 | Disposition: A | Discharge status: Home / Self Care | Payer: BLUE CROSS/BLUE SHIELD | Source: Ambulatory Visit | Attending: Obstetrics & Gynecology | Admitting: Obstetrics & Gynecology

## 2016-06-15 DIAGNOSIS — Z1231 Encounter for screening mammogram for malignant neoplasm of breast: Secondary | ICD-10-CM

## 2016-06-16 ENCOUNTER — Other Ambulatory Visit: Payer: Self-pay | Admitting: Obstetrics & Gynecology

## 2016-06-16 DIAGNOSIS — R928 Other abnormal and inconclusive findings on diagnostic imaging of breast: Secondary | ICD-10-CM

## 2016-06-21 ENCOUNTER — Ambulatory Visit
Admission: RE | Admit: 2016-06-21 | Discharge: 2016-06-21 | Disposition: A | Discharge status: Home / Self Care | Payer: BLUE CROSS/BLUE SHIELD | Source: Ambulatory Visit | Attending: Obstetrics & Gynecology | Admitting: Obstetrics & Gynecology

## 2016-06-21 DIAGNOSIS — R928 Other abnormal and inconclusive findings on diagnostic imaging of breast: Secondary | ICD-10-CM

## 2016-10-11 ENCOUNTER — Ambulatory Visit (INDEPENDENT_AMBULATORY_CARE_PROVIDER_SITE_OTHER): Payer: BLUE CROSS/BLUE SHIELD | Admitting: Medical

## 2016-10-11 ENCOUNTER — Encounter: Payer: Self-pay | Admitting: Medical

## 2016-10-11 VITALS — BP 124/88 | HR 100 | Wt 239.4 lb

## 2016-10-11 DIAGNOSIS — R200 Anesthesia of skin: Secondary | ICD-10-CM | POA: Diagnosis not present

## 2016-10-11 DIAGNOSIS — G8929 Other chronic pain: Secondary | ICD-10-CM | POA: Diagnosis not present

## 2016-10-11 DIAGNOSIS — M5440 Lumbago with sciatica, unspecified side: Secondary | ICD-10-CM

## 2016-10-11 MED ORDER — CYCLOBENZAPRINE HCL 10 MG PO TABS: ORAL_TABLET | ORAL | 0 refills | Status: DC

## 2016-10-11 NOTE — Patient Instructions (Signed)
Normal blood pressure is 120/70 up to 130/80.   Check your pressures at the local drug store a few times per week.   If you see readings >140/90, then return to discuss  Work on abdominal and back stretching exercise as well as daily stretching routine  You can use OTC aleve or ibuprofen for pain  Go for xray

## 2016-10-11 NOTE — Progress Notes (Signed)
Subjective: Chief Complaint  Patient presents with  . Back Pain    back pain x 1 week, back of her legs fee numb,    Here for c/o back pain.   She does have a hx/o back pain.  She notes ongoing problems with back for years, but this past weekend had a lot of pain.   Felt like an explosion of pain in her back this past Sunday, yesterday.  Was getting in the bed when she felt the pain.  Denies any recent injury, trauma or fall.   She rested a lot last week during vacation, but later in the week when sitting for hours, started getting numbness in lower legs in general.  Felt tingling/somewhat numb.  Could still walk fine, no weakness.   No leg swelling.  No incontinence.   No blood in urine or stool.   If she moves around and stands the leg numbness improves.   She did recently start a second job.  No UTI concern, no urinary frequency or urgency. At the end of the visit she reports that she was unable to work her 2nd job (20 hours per week) the past 2 weeks and doesn't plan to work this week either on the second job due to the pain    She notes her BP was elevated at recent gynecology visit.  No chest pain, no palpitations ,edema, SOB.  No prior hx/o elevate BPs.   No other aggravating or relieving factors. No other complaint.    Past Medical History:  Diagnosis Date  . Arthralgia of hip   . Arthralgia of knee   . Obesity    No current outpatient prescriptions on file prior to visit.   No current facility-administered medications on file prior to visit.    Past Surgical History:  Procedure Laterality Date  . WISDOM TOOTH EXTRACTION     ROS as in subjective+    Objective: BP 124/88   Pulse 100   Wt 239 lb 6.4 oz (108.6 kg)   SpO2 96%   BMI 42.41 kg/m   Wt Readings from Last 3 Encounters:  10/11/16 239 lb 6.4 oz (108.6 kg)  10/29/13 253 lb (114.8 kg)  09/06/13 247 lb (112 kg)   BP Readings from Last 3 Encounters:  10/11/16 124/88  10/29/13 130/78  09/06/13 120/80      General appearence: alert, no distress, WD/WN, obese AA female Abdomen: +bs, soft, non tender, non distended, no masses, no hepatomegaly, no splenomegaly Back: there is a slight asymmetry to low back crease, lower on left low back, tender thoracic and lumbar paraspinal muscles, tender midline lumbar region, mild pain with ROM which is mostly full.   Otherwise non tender Musculoskeletal: legs nontender, no swelling, no obvious deformity.  She does have relatively flat feet bilat Extremities: no edema, no cyanosis, no clubbing Pulses: 2+ symmetric, upper and lower extremities, normal cap refill Neurological: alert, oriented x 3, CN2-12 intact, strength normal upper extremities and lower extremities, sensation normal throughout, DTRs 2+ throughout, no cerebellar signs, gait normal Psychiatric: normal affect, behavior normal, pleasant     Assessment: Encounter Diagnoses  Name Primary?  . Chronic bilateral low back pain with sciatica, sciatica laterality unspecified Yes  . Leg numbness      Plan: Go for xrays of T and L spine.  discussed possible causes.  I have seen her on several prior visits before 2016 for back pain.    Advised daily stretching routine, can use OTC analgesic for  pain, flexeril prn as discussed. Discussed core strengthening exercises.    Will consider chiropractor therapy if xray is normal.  Also advised weight loss to take pressure of the back as well.  She will monitor BPs at the pharmacy for the next week or 2.  Normal BP reading today   Rebecca Goodman was seen today for back pain.  Diagnoses and all orders for this visit:  Chronic bilateral low back pain with sciatica, sciatica laterality unspecified -     DG Lumbar Spine Complete; Future -     DG Thoracic Spine W/Swimmers; Future  Leg numbness -     DG Lumbar Spine Complete; Future -     DG Thoracic Spine W/Swimmers; Future  Other orders -     cyclobenzaprine (FLEXERIL) 10 MG tablet; 1/2-1 tablet po  QHS

## 2017-04-13 ENCOUNTER — Encounter: Payer: Self-pay | Admitting: Medical

## 2017-04-13 ENCOUNTER — Ambulatory Visit: Payer: BLUE CROSS/BLUE SHIELD | Admitting: Medical

## 2017-04-13 VITALS — BP 128/80 | HR 93 | Wt 239.4 lb

## 2017-04-13 DIAGNOSIS — M25531 Pain in right wrist: Secondary | ICD-10-CM

## 2017-04-13 DIAGNOSIS — M659 Synovitis and tenosynovitis, unspecified: Secondary | ICD-10-CM

## 2017-04-13 DIAGNOSIS — M79641 Pain in right hand: Secondary | ICD-10-CM | POA: Diagnosis not present

## 2017-04-13 MED ORDER — HYDROCODONE-ACETAMINOPHEN 7.5-325 MG PO TABS: 1.0000 | ORAL_TABLET | Freq: Four times a day (QID) | ORAL | 0 refills | Status: DC | PRN

## 2017-04-13 NOTE — Progress Notes (Signed)
Subjective: Chief Complaint  Patient presents with  . Hand Pain    hand and wrist pain   started last thursday and  wearing brace   Here for right hand wrist and hand pain.  Denies injury or trauma.  Been having problems for about a week this time, but has had problems with the wrist and hand for years.  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  Using a wrist sleeve for support.  Usually wearing the sleeve and resting with help the pain resolve after a few days, but not helping currently.  typically will get flare up of her hand and wrist maybe once or twice a year.   No numbness, no tingling, but at times feels weak in the hand.  Currently can't turn a door knob or pick things up with the right hand as well.  Uses some ibuprofen some.   Uses a topical freeze balm.   Left hand is fine.  She is right handed.   Works in the office, on computer all day.    No other aggravating or relieving factors. No other complaint.  Past Medical History:  Diagnosis Date  . Arthralgia of hip   . Arthralgia of knee   . Obesity    Current Outpatient Medications on File Prior to Visit  Medication Sig Dispense Refill  . cyclobenzaprine (FLEXERIL) 10 MG tablet 1/2-1 tablet po QHS 20 tablet 0   No current facility-administered medications on file prior to visit.    ROS as in subjective    Objective: BP 128/80   Pulse 93   Wt 239 lb 6.4 oz (108.6 kg)   SpO2 98%   BMI 42.41 kg/m   Gen: wd, wn, nad, obese AA female Skin: No obvious bruising or erythema She is holding the right hand with the left hand for support and seems guarded She is quite tender over the right medial and midline wrist, unable to bend the wrist much at all due to pain, the central wrist is a little puffy, tender over the third and fourth MCPs, tender over the hyper thenar eminence and thumb MCP, she has normal range of motion of thumbs but certainly has pain flexing or extending the third or fourth  finger or thumb.  She was guarded with me touching her wrist and hand due to pain Rest of right arm nontender, no deformity Normal sensation of right hand and arm    Assessment: Encounter Diagnoses  Name Primary?  . Wrist pain, acute, right Yes  . Hand pain, right   . Tenosynovitis of hand      Plan: We discussed her symptoms, diagnosis, treatment recommendations, timeframe for this to improve and follow-up.  Reviewed instructions below.  I wrote her a work restriction note.  Patient Instructions   Encounter Diagnoses  Name Primary?  . Wrist pain, acute, right Yes  . Hand pain, right   . Tenosynovitis of hand    Recommendations:  For the next 7 days, use over-the-counter ibuprofen 200 mg, 4 tablets 3 times a day.  Try to use ice for 20 minutes 3 times a day for the next 5 days  For example you can use a bag of frozen peas or a bag of ice water.  Use a cloth in between your skin and the ice to protect it from frostbite  I prescribed a medication today to help with worse pain, hydrocodone.  You can use this 1 tablet  every 4-6 hours as needed for worse pain but caution as this can make you sleepy  Consider using an arm sling to fully rest the arm for a couple hours at a time.  You can use it throughout the day as needed.  I recommend using the arm sling for at least the next 4-5 days  Consider getting a larger different reinforced wrist splint over-the-counter.  The one you are using is good but there is probably better options over-the-counter  Obviously you need to rest the arm and not use that arm for the next 1-2 weeks when possible  Let us plan a follow-up in 2 weeks or so.  If you are not fully improved within the next 3-4 weeks then I would recommend testing for carpal tunnel syndrome.    The test we use for carpal tunnel syndrome is called nerve conduction studies and we have to refer you for that   Emon was seen today for hand pain.  Diagnoses and all orders  for this visit:  Wrist pain, acute, right  Hand pain, right  Tenosynovitis of hand  Other orders -     HYDROcodone-acetaminophen (NORCO) 7.5-325 MG tablet; Take 1 tablet by mouth every 6 (six) hours as needed for moderate pain.

## 2017-04-13 NOTE — Patient Instructions (Signed)
Encounter Diagnoses  Name Primary?  . Wrist pain, acute, right Yes  . Hand pain, right   . Tenosynovitis of hand    Recommendations:  For the next 7 days, use over-the-counter ibuprofen 200 mg, 4 tablets 3 times a day.  Try to use ice for 20 minutes 3 times a day for the next 5 days  For example you can use a bag of frozen peas or a bag of ice water.  Use a cloth in between your skin and the ice to protect it from frostbite  I prescribed a medication today to help with worse pain, hydrocodone.  You can use this 1 tablet every 4-6 hours as needed for worse pain but caution as this can make you sleepy  Consider using an arm sling to fully rest the arm for a couple hours at a time.  You can use it throughout the day as needed.  I recommend using the arm sling for at least the next 4-5 days  Consider getting a larger different reinforced wrist splint over-the-counter.  The one you are using is good but there is probably better options over-the-counter  Obviously you need to rest the arm and not use that arm for the next 1-2 weeks when possible  Let us plan a follow-up in 2 weeks or so.  If you are not fully improved within the next 3-4 weeks then I would recommend testing for carpal tunnel syndrome.    The test we use for carpal tunnel syndrome is called nerve conduction studies and we have to refer you for that

## 2017-04-27 ENCOUNTER — Ambulatory Visit: Payer: BLUE CROSS/BLUE SHIELD | Admitting: Medical

## 2017-04-28 ENCOUNTER — Ambulatory Visit: Payer: BLUE CROSS/BLUE SHIELD | Admitting: Medical

## 2017-04-28 ENCOUNTER — Encounter: Payer: Self-pay | Admitting: Medical

## 2017-04-28 VITALS — BP 132/80 | HR 109 | Wt 238.8 lb

## 2017-04-28 DIAGNOSIS — M79641 Pain in right hand: Secondary | ICD-10-CM | POA: Diagnosis not present

## 2017-04-28 DIAGNOSIS — R200 Anesthesia of skin: Secondary | ICD-10-CM

## 2017-04-28 DIAGNOSIS — M25531 Pain in right wrist: Secondary | ICD-10-CM

## 2017-04-28 DIAGNOSIS — R202 Paresthesia of skin: Secondary | ICD-10-CM

## 2017-04-28 DIAGNOSIS — M659 Synovitis and tenosynovitis, unspecified: Secondary | ICD-10-CM | POA: Diagnosis not present

## 2017-04-28 IMAGING — MG 2D DIGITAL DIAGNOSTIC UNILATERAL RIGHT MAMMOGRAM WITH CAD AND AD
9 series · 9 of 25 positions shown · non-contrast
Comparison: June 15, 2016

CLINICAL DATA: Possible mass/distortion right breast identified on
recent screening mammogram.

EXAM:
2D DIGITAL DIAGNOSTIC UNILATERAL RIGHT MAMMOGRAM WITH CAD AND
ADJUNCT TOMO

[R CC (1 of 2)]
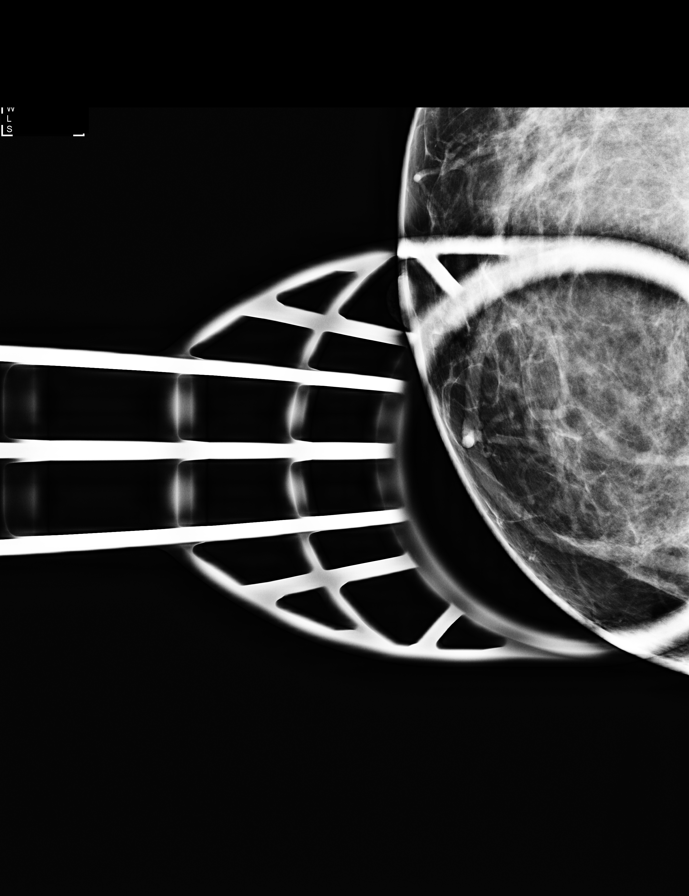

[R ML]
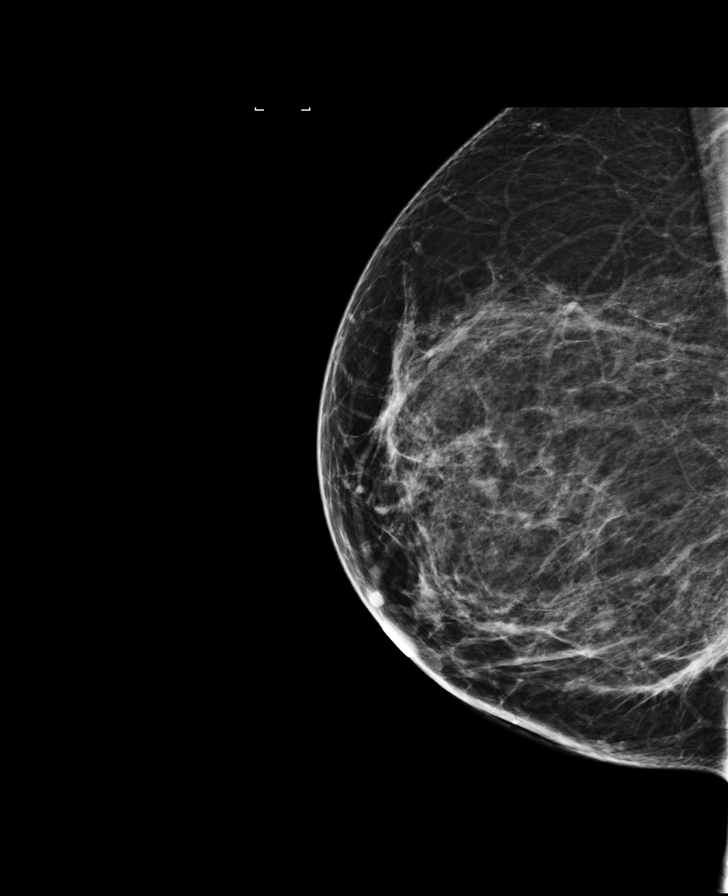

[R ML synth-2D]
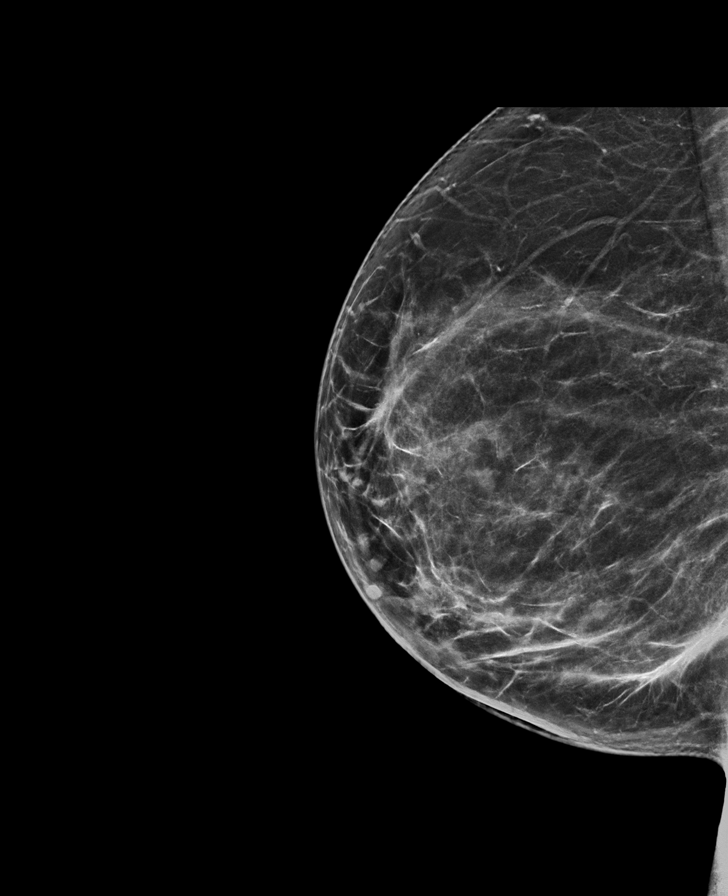

[R MLO]
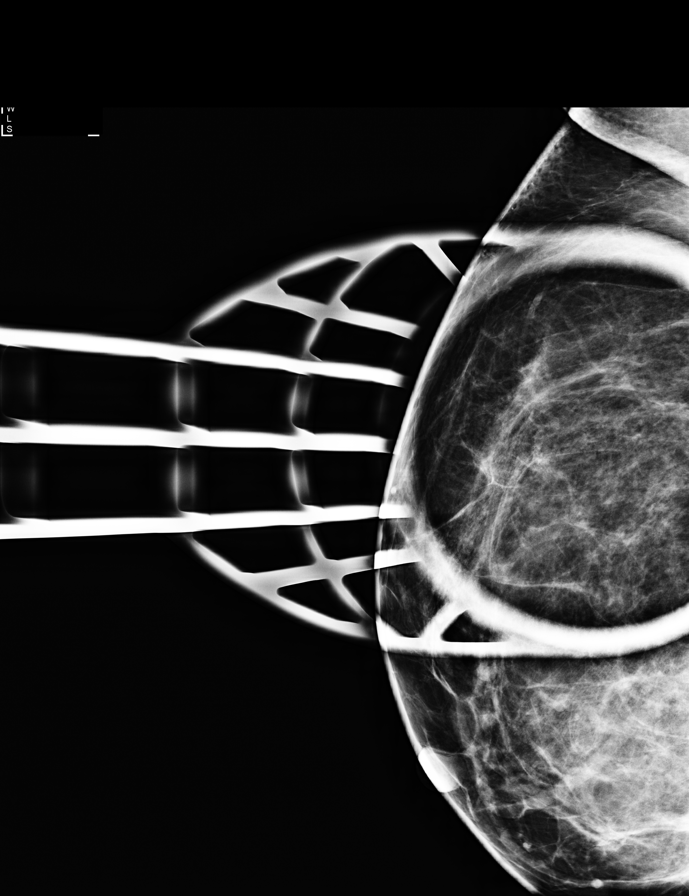

[R CC (2 of 2)]
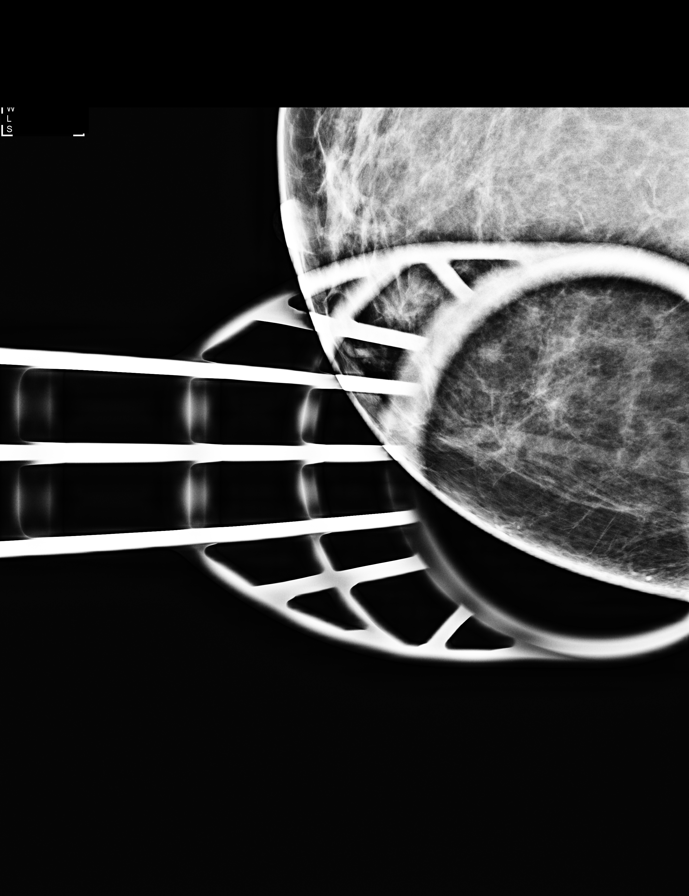

[R MLO tomo · tomo slice 37/73.0]
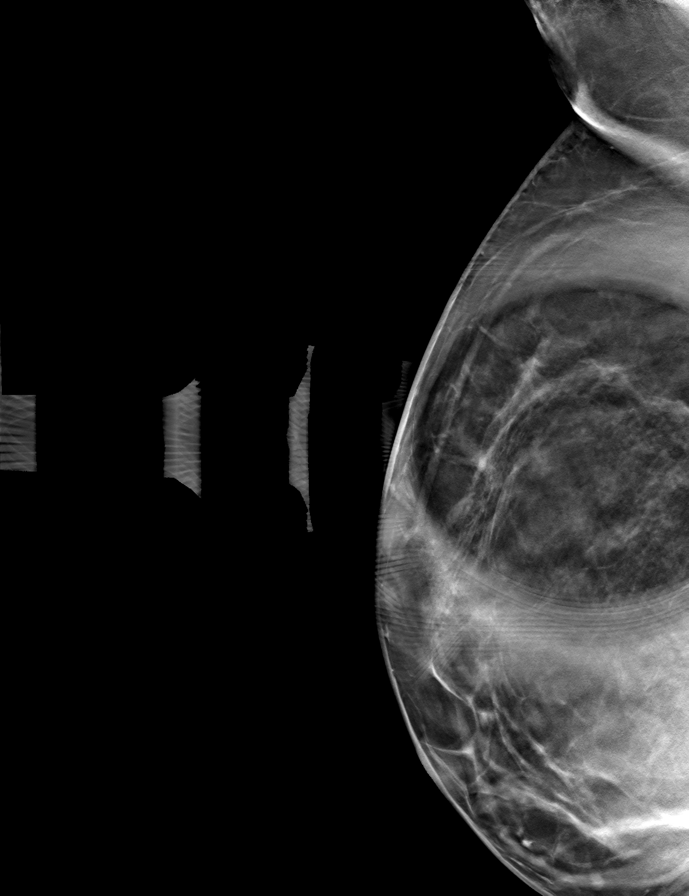

[R CC tomo (1 of 2) · tomo slice 31/60.0]
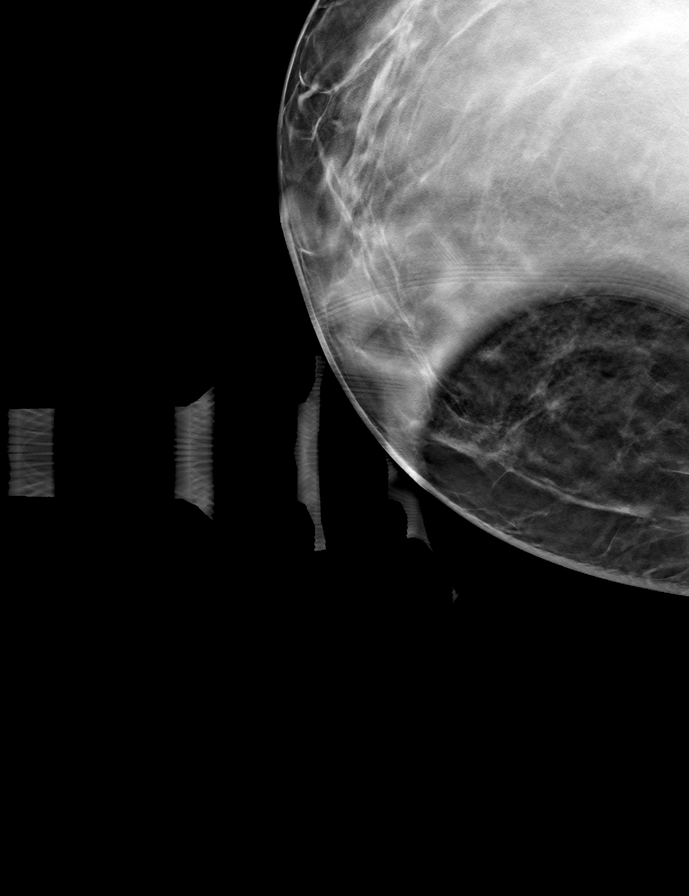

[R ML tomo · tomo slice 43/85.0]
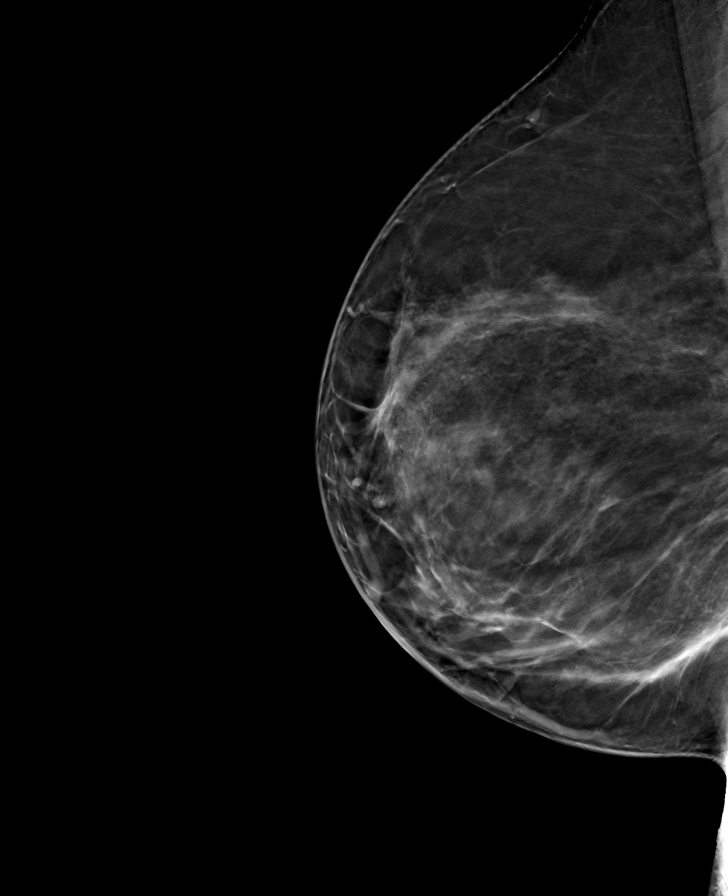

[R CC tomo (2 of 2) · tomo slice 35/68.0]
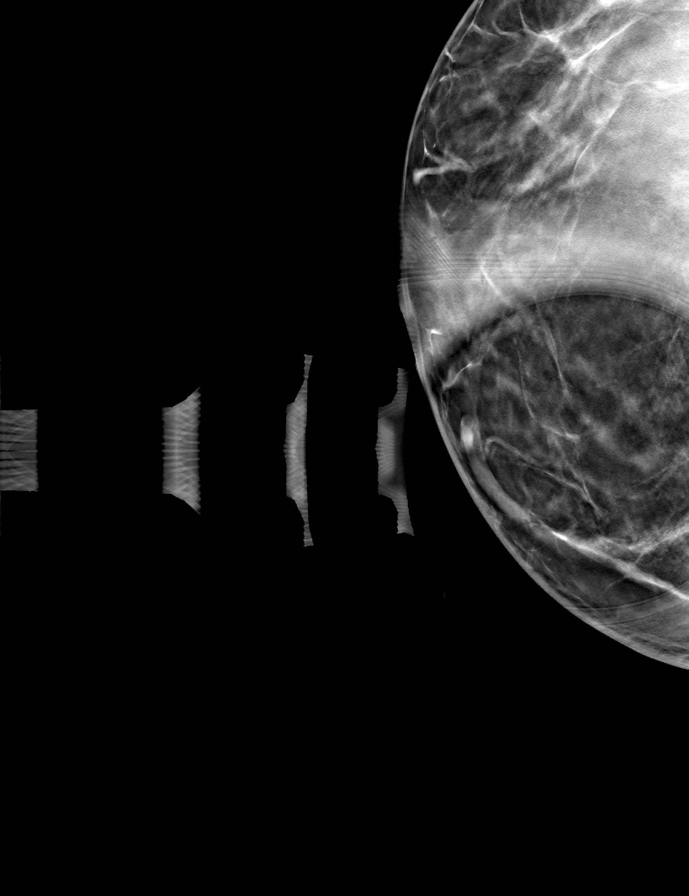

[9 of 25 positions shown; findings below may reference images not displayed]

ACR Breast Density Category b: There are scattered areas of
fibroglandular density.
FINDINGS: Additional focal spot compression and 90 degrees lateral views of
the right breast including tomography show no persistent mass or
architectural distortion. There are no findings to suggest
malignancy.

Mammographic images were processed with CAD.
IMPRESSION: No evidence of malignancy in the right breast.

RECOMMENDATION:
Screening mammogram in one year.(Code:[id number]-[id number])

I have discussed the findings and recommendations with the patient.
Results were also provided in writing at the conclusion of the
visit. If applicable, a reminder letter will be sent to the patient
regarding the next appointment.

BI-RADS CATEGORY  1: Negative.

## 2017-04-28 NOTE — Progress Notes (Signed)
Subjective: Chief Complaint  Patient presents with  . follow up    follow up from wrist pain better then last week,  still having some pain but not like before    Here for right hand wrist and hand pain, recheck on tenosynovitis.  I saw her about 2 weeks ago.  She is using the wrist splint daily.  She notes she is probably 80% better.  She is definitely improved in terms of pain, but not back to normal either    She is using splint still daily, using arm sling to support hand/arm daily when not at home.  Her work includes typing all day long which she has not been able to do she did go back to work this past week some but was unable to really do her job.  She notes that she still cannot turn a doorknob or pick up something with the right hand that has any weight.  At her last visit she had been having problems for about a week this time, but has had problems with the wrist and hand for years.  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  Using a wrist sleeve for support.  Usually wearing the sleeve and resting with help the pain resolve after a few days, but not helping currently.  typically will get flare up of her hand and wrist maybe once or twice a year.   No numbness, no tingling, but at times feels weak in the hand.  Currently can't turn a door knob or pick things up with the right hand as well.  Uses some ibuprofen some.   Uses a topical freeze balm.   Left hand is fine.  She is right handed.   Works in the office, on computer all day.    No other aggravating or relieving factors. No other complaint.  Past Medical History:  Diagnosis Date  . Arthralgia of hip   . Arthralgia of knee   . Obesity    Current Outpatient Medications on File Prior to Visit  Medication Sig Dispense Refill  . cyclobenzaprine (FLEXERIL) 10 MG tablet 1/2-1 tablet po QHS (Patient not taking: Reported on 04/28/2017) 20 tablet 0  . HYDROcodone-acetaminophen (NORCO) 7.5-325 MG tablet  Take 1 tablet by mouth every 6 (six) hours as needed for moderate pain. (Patient not taking: Reported on 04/28/2017) 15 tablet 0   No current facility-administered medications on file prior to visit.    ROS as in subjective    Objective: BP 132/80   Pulse (!) 109   Wt 238 lb 12.8 oz (108.3 kg)   SpO2 96%   BMI 42.30 kg/m   Gen: wd, wn, nad Skin: No obvious bruising or erythema She is holding the right hand with the left hand for support  She is still mildly tender over the right medial and midline wrist, but today is able to bend the wrist relatively full ROM but with some pain.  Still tender over hypothenar eminence and thumb MCP, she has normal range of motion of thumbs but has pain flexing or extending the third or fourth finger or thumb.  She was not guarded with me touching her wrist and hand today like last visit Rest of right arm nontender, no deformity Normal sensation of right hand and arm    Assessment: Encounter Diagnoses  Name Primary?  . Tenosynovitis of hand Yes  . Hand pain, right   . Numbness and tingling   .  Wrist pain, acute, right      Plan: We discussed her symptoms, diagnosis, treatment recommendations, timeframe for this to improve and follow-up.  Reviewed instructions below.  F/u 2 wk  Patient Instructions  Recommendations:  I am referring you to physical therapy.  If you have not heard back from them by Monday then call back  I still want you to continue ibuprofen 200 mg, 3 tablets, 2 times a day.  Try to use ice for 20 minutes twice daily   for example you can use a bag of frozen peas or a bag of ice water.  Use a cloth in between your skin and the ice to protect it from frostbite  You can use the arm sling, but less try to only use it part of the day at this point not throughout the day  Similarly try using the arm sling at night and during the day but try going with the arm sling off for an hour or 2 at a time at least twice daily     Let us plan a follow-up in 2 weeks.    Annalynn was seen today for follow up.  Diagnoses and all orders for this visit:  Tenosynovitis of hand  Hand pain, right  Numbness and tingling  Wrist pain, acute, right

## 2017-04-28 NOTE — Patient Instructions (Signed)
Recommendations:  I am referring you to physical therapy.  If you have not heard back from them by Monday then call back  I still want you to continue ibuprofen 200 mg, 3 tablets, 2 times a day.  Try to use ice for 20 minutes twice daily   for example you can use a bag of frozen peas or a bag of ice water.  Use a cloth in between your skin and the ice to protect it from frostbite  You can use the arm sling, but less try to only use it part of the day at this point not throughout the day  Similarly try using the arm sling at night and during the day but try going with the arm sling off for an hour or 2 at a time at least twice daily    Let us plan a follow-up in 2 weeks.

## 2017-05-02 ENCOUNTER — Telehealth: Payer: Self-pay | Admitting: Medical

## 2017-05-02 NOTE — Telephone Encounter (Signed)
  Fax from [email or web address],  disability forms sent back to be completed and faxed back

## 2017-05-03 NOTE — Addendum Note (Signed)
Addended by: Winn JockVALENTINE, Mataya Kilduff N on: 05/03/2017 03:35 PM   Modules accepted: Orders

## 2017-05-05 NOTE — Telephone Encounter (Signed)
Copy and fax back forms.

## 2017-05-12 ENCOUNTER — Encounter: Payer: Self-pay | Admitting: Medical

## 2017-05-12 ENCOUNTER — Ambulatory Visit: Payer: BLUE CROSS/BLUE SHIELD | Admitting: Medical

## 2017-05-12 VITALS — BP 138/82 | HR 92 | Wt 236.4 lb

## 2017-05-12 DIAGNOSIS — M79641 Pain in right hand: Secondary | ICD-10-CM

## 2017-05-12 DIAGNOSIS — M25531 Pain in right wrist: Secondary | ICD-10-CM

## 2017-05-12 DIAGNOSIS — M659 Synovitis and tenosynovitis, unspecified: Secondary | ICD-10-CM | POA: Diagnosis not present

## 2017-05-12 NOTE — Progress Notes (Signed)
Subjective: Chief Complaint  Patient presents with  . follow up    follow up from wrist pain ,feel alot better    Here for right hand wrist and hand pain, recheck on tenosynovitis.  Since last visit she didn't end up going to physical therapy.  She has been using stress ball to work on home exercises.  Has stopped using arm sling for complete rest.   Has been wearing wrist splint only some of the time currently.   Still using ice, ibuprofen.    Feels much improved, not 100% but a lot better. Ready to go back to work.    She is right handed.   Works in the office, on computer all day.    No other aggravating or relieving factors. No other complaint.  Past Medical History:  Diagnosis Date  . Arthralgia of hip   . Arthralgia of knee   . Obesity    Current Outpatient Medications on File Prior to Visit  Medication Sig Dispense Refill  . cyclobenzaprine (FLEXERIL) 10 MG tablet 1/2-1 tablet po QHS (Patient not taking: Reported on 04/28/2017) 20 tablet 0  . HYDROcodone-acetaminophen (NORCO) 7.5-325 MG tablet Take 1 tablet by mouth every 6 (six) hours as needed for moderate pain. (Patient not taking: Reported on 04/28/2017) 15 tablet 0   No current facility-administered medications on file prior to visit.    ROS as in subjective    Objective: BP 138/82   Pulse 92   Wt 236 lb 6.4 oz (107.2 kg)   BMI 41.88 kg/m   Gen: wd, wn, nad Skin: No obvious bruising or erythema Today with mild right medial and midline wrist, but today is able to bend the wrist relatively full ROM without pain.  Mild pain with resisted wrist ROM.  No longer tender over hypothenar eminence and thumb MCP Rest of right arm nontender, no deformity Normal sensation of right hand and arm    Assessment: Encounter Diagnoses  Name Primary?  . Tenosynovitis of hand Yes  . Hand pain, right   . Wrist pain, acute, right      Plan: We discussed her symptoms, diagnosis, treatment recommendations.  She seems to be about  90% improved.  I will release her back to work today with her agreement.  Of note, she didn't follow my recommendations to see physical therapy though.  At this point, resume activity gradually if possible, can still use ice therapy or NSAID prn for the next 2 weeks, if need to use wrist splint, just use QHS.   C/t stretching exercises she has been doing.   If any worsening in the coming weeks, will need to be referred to ortho.  Gave note released back to work.    Ambermarie was seen today for follow up.  Diagnoses and all orders for this visit:  Tenosynovitis of hand  Hand pain, right  Wrist pain, acute, right

## 2017-11-15 ENCOUNTER — Encounter: Payer: Self-pay | Admitting: Medical

## 2017-11-15 ENCOUNTER — Ambulatory Visit: Payer: BLUE CROSS/BLUE SHIELD | Admitting: Medical

## 2017-11-15 VITALS — BP 150/90 | HR 104 | Temp 97.9°F | Ht 63.0 in | Wt 224.0 lb

## 2017-11-15 DIAGNOSIS — R202 Paresthesia of skin: Secondary | ICD-10-CM | POA: Diagnosis not present

## 2017-11-15 DIAGNOSIS — M5441 Lumbago with sciatica, right side: Secondary | ICD-10-CM | POA: Diagnosis not present

## 2017-11-15 DIAGNOSIS — M5442 Lumbago with sciatica, left side: Secondary | ICD-10-CM | POA: Diagnosis not present

## 2017-11-15 MED ORDER — HYDROCODONE-ACETAMINOPHEN 7.5-325 MG PO TABS: 1.0000 | ORAL_TABLET | Freq: Four times a day (QID) | ORAL | 0 refills | Status: DC | PRN

## 2017-11-15 MED ORDER — CYCLOBENZAPRINE HCL 10 MG PO TABS: ORAL_TABLET | ORAL | 0 refills | Status: DC

## 2017-11-15 NOTE — Progress Notes (Signed)
Subjective: Chief Complaint  Patient presents with  . Back Pain    x 2-3 days   . Numbness    bilateral knees radiating down legs    Here for back pain and leg numbness. She was helping daughter move into her house this past weekend, was doing a lot of lifting.    Awoke Sunday with back pain, but later that day felt legs go numb from the knees down to her feet.  She is moving around slow, is able to ambulate, hurts in her low back.  From knee down legs feel like they are asleep.  She denies fever, blood in stool or blood in the urine, denies saddle anesthesia, denies fall.  No other specific injury just lifting a lot of items this past weekend including furniture.  Using ibuprofen with not much relief.  She saw me last year about a year ago for a similar back issue that resolved within a short period of time.  She has not had ongoing issues with her back since then, nor has she had ongoing numbness of her back till now.  No other aggravating or relieving factors. No other complaint.  ROS as in subjective      Objective:   BP (!) 150/90   Pulse (!) 104   Temp 97.9 F (36.6 C) (Oral)   Ht 5\' 3"  (1.6 m)   Wt 224 lb (101.6 kg)   SpO2 96%   BMI 39.68 kg/m   General appearance: alert, no distress, WD/WN Back: paraspinal bilat Lumbar tenderness, ROM reduced due to pain, otherwise nontender, no obvious scoliosis MSK: legs nontender, normal ROM, no swelling, rest of LE unremarkable Neuro:  Seems somewhat weak in legs, 4-5/5 strength in general, likely guarded due to pain, sensation of lower legs maybe sightly blunted, but she is abel to discern sharp from light touch and 2 point discrimination,  DTRs 1+ throughout LE.   Normal toe walk, but pain with heel walk.   -SLR Pulses: 2+ symmetric, upper and lower extremities, normal cap refill Ext: no edema      Assessment: Encounter Diagnoses  Name Primary?  . Acute bilateral low back pain with bilateral sciatica Yes  . Leg paresthesia       Plan: Back pain, radicular pain, likely related to strenuous activity and lifting while moving her daughter into house this past weekend.   Begin Ibuprofen OTC 4 tablets TID for the next 4-5 days, medications below prn.  discussed risks/benefits and proper use of medications below.    She is intolerance to prednisone but can take Ibuprofen just fine.   Discussed few days of rest and gentle ROM followed by increased ROM and stretching as tolerated.   If worse symptoms though in the meantime, such as more severe pain, incontinence, saddle anesthesia, falls, fever, or other go to the hospital.  Otherwise follow-up if not much improved within the next 5 to 7 days.  Gave note for work out the next 2 days, advised no lifting over 10 pounds for the next week, no other strenuous activity for the next week.  Then gradual return to activity as tolerated  Of note she never went for back x-rays last year when she had similar symptoms.  We discussed imaging if she gets worse in the next few days   Thora was seen today for back pain and numbness.  Diagnoses and all orders for this visit:  Acute bilateral low back pain with bilateral sciatica  Leg paresthesia  Other orders -     cyclobenzaprine (FLEXERIL) 10 MG tablet; 1/2-1 tablet po QHS -     HYDROcodone-acetaminophen (NORCO) 7.5-325 MG tablet; Take 1 tablet by mouth every 6 (six) hours as needed for moderate pain.   No follow-ups on file.

## 2018-04-02 ENCOUNTER — Emergency Department (HOSPITAL_COMMUNITY): Payer: Self-pay

## 2018-04-02 ENCOUNTER — Encounter (HOSPITAL_COMMUNITY): Payer: Self-pay | Admitting: Emergency Medicine

## 2018-04-02 ENCOUNTER — Inpatient Hospital Stay (HOSPITAL_COMMUNITY)
Admission: EM | Admit: 2018-04-02 | Discharge: 2018-04-07 | DRG: 096 | Disposition: A | Discharge status: Home / Self Care | Payer: Self-pay | Attending: Family Medicine | Admitting: Family Medicine

## 2018-04-02 ENCOUNTER — Other Ambulatory Visit: Payer: Self-pay

## 2018-04-02 DIAGNOSIS — K08409 Partial loss of teeth, unspecified cause, unspecified class: Secondary | ICD-10-CM | POA: Diagnosis present

## 2018-04-02 DIAGNOSIS — R200 Anesthesia of skin: Secondary | ICD-10-CM

## 2018-04-02 DIAGNOSIS — E538 Deficiency of other specified B group vitamins: Secondary | ICD-10-CM

## 2018-04-02 DIAGNOSIS — D539 Nutritional anemia, unspecified: Secondary | ICD-10-CM

## 2018-04-02 DIAGNOSIS — R296 Repeated falls: Secondary | ICD-10-CM | POA: Diagnosis present

## 2018-04-02 DIAGNOSIS — R202 Paresthesia of skin: Secondary | ICD-10-CM

## 2018-04-02 DIAGNOSIS — Z79899 Other long term (current) drug therapy: Secondary | ICD-10-CM

## 2018-04-02 DIAGNOSIS — F17210 Nicotine dependence, cigarettes, uncomplicated: Secondary | ICD-10-CM | POA: Diagnosis present

## 2018-04-02 DIAGNOSIS — G610 Guillain-Barre syndrome: Principal | ICD-10-CM | POA: Diagnosis present

## 2018-04-02 HISTORY — DX: Deficiency of other specified B group vitamins: E53.8

## 2018-04-02 HISTORY — DX: Nutritional anemia, unspecified: D53.9

## 2018-04-02 LAB — CBC
HCT: 31.3 % — ABNORMAL LOW (ref 36.0–46.0)
HEMATOCRIT: 30.8 % — AB (ref 36.0–46.0)
Hemoglobin: 10.3 g/dL — ABNORMAL LOW (ref 12.0–15.0)
Hemoglobin: 9.9 g/dL — ABNORMAL LOW (ref 12.0–15.0)
MCH: 36.3 pg — ABNORMAL HIGH (ref 26.0–34.0)
MCH: 35.2 pg — ABNORMAL HIGH (ref 26.0–34.0)
MCHC: 32.9 g/dL (ref 30.0–36.0)
MCHC: 32.1 g/dL (ref 30.0–36.0)
MCV: 110.2 fL — ABNORMAL HIGH (ref 80.0–100.0)
MCV: 109.6 fL — ABNORMAL HIGH (ref 80.0–100.0)
Platelets: 391 10*3/uL (ref 150–400)
Platelets: 355 10*3/uL (ref 150–400)
RBC: 2.84 MIL/uL — ABNORMAL LOW (ref 3.87–5.11)
RBC: 2.81 MIL/uL — ABNORMAL LOW (ref 3.87–5.11)
RDW: 15.1 % (ref 11.5–15.5)
RDW: 15.1 % (ref 11.5–15.5)
WBC: 8 10*3/uL (ref 4.0–10.5)
WBC: 8.2 10*3/uL (ref 4.0–10.5)
nRBC: 0.2 % (ref 0.0–0.2)
nRBC: 0 % (ref 0.0–0.2)

## 2018-04-02 LAB — I-STAT TROPONIN, ED: Troponin i, poc: 0 ng/mL (ref 0.00–0.08)

## 2018-04-02 LAB — BASIC METABOLIC PANEL
Anion gap: 6 (ref 5–15)
BUN: 7 mg/dL (ref 6–20)
CO2: 24 mmol/L (ref 22–32)
Calcium: 9.1 mg/dL (ref 8.9–10.3)
Chloride: 108 mmol/L (ref 98–111)
Creatinine, Ser: 0.56 mg/dL (ref 0.44–1.00)
GFR calc Af Amer: >60 mL/min (ref >60)
GFR calc non Af Amer: >60 mL/min (ref >60)
Glucose, Bld: 87 mg/dL (ref 70–99)
Potassium: 4.7 mmol/L (ref 3.5–5.1)
Sodium: 138 mmol/L (ref 135–145)

## 2018-04-02 LAB — I-STAT BETA HCG BLOOD, ED (MC, WL, AP ONLY): I-stat hCG, quantitative: 15 m[IU]/mL — ABNORMAL HIGH (ref <5)

## 2018-04-02 LAB — CBG MONITORING, ED: Glucose-Capillary: 79 mg/dL (ref 70–99)

## 2018-04-02 MED ORDER — IMMUNE GLOBULIN (HUMAN) 5 GM/50ML IV SOLN
400.0000 mg/kg | INTRAVENOUS | Status: AC
  Administered 2018-04-03 – 2018-04-06 (×4): 35 g via INTRAVENOUS
  Filled 2018-04-02 (×4): qty 50

## 2018-04-02 MED ORDER — ACETAMINOPHEN 325 MG PO TABS
650.0000 mg | ORAL_TABLET | Freq: Four times a day (QID) | ORAL | Status: DC | PRN
  Administered 2018-04-03: 650 mg via ORAL
  Filled 2018-04-02: qty 2

## 2018-04-02 MED ORDER — IBUPROFEN 400 MG PO TABS: 600.0000 mg | ORAL_TABLET | Freq: Four times a day (QID) | ORAL | Status: DC | PRN

## 2018-04-02 MED ORDER — GADOBUTROL 1 MMOL/ML IV SOLN
10.0000 mL | Freq: Once | INTRAVENOUS | Status: AC | PRN
  Administered 2018-04-02: 10 mL via INTRAVENOUS

## 2018-04-02 MED ORDER — GABAPENTIN 100 MG PO CAPS
100.0000 mg | ORAL_CAPSULE | Freq: Three times a day (TID) | ORAL | Status: DC
  Administered 2018-04-03 (×2): 100 mg via ORAL
  Filled 2018-04-02 (×2): qty 1

## 2018-04-02 MED ORDER — KETOROLAC TROMETHAMINE 15 MG/ML IJ SOLN
15.0000 mg | Freq: Once | INTRAMUSCULAR | Status: AC
  Administered 2018-04-03: 15 mg via INTRAVENOUS
  Filled 2018-04-02: qty 1

## 2018-04-02 MED ORDER — ACETAMINOPHEN 650 MG RE SUPP: 650.0000 mg | Freq: Four times a day (QID) | RECTAL | Status: DC | PRN

## 2018-04-02 MED ORDER — ENOXAPARIN SODIUM 40 MG/0.4ML ~~LOC~~ SOLN
40.0000 mg | SUBCUTANEOUS | Status: DC
  Administered 2018-04-03 – 2018-04-06 (×4): 40 mg via SUBCUTANEOUS
  Filled 2018-04-02 (×4): qty 0.4

## 2018-04-02 MED ORDER — IMMUNE GLOBULIN (HUMAN) 10 GM/100ML IV SOLN
400.0000 mg/kg | INTRAVENOUS | Status: DC
  Administered 2018-04-03: 35 g via INTRAVENOUS
  Filled 2018-04-02: qty 350

## 2018-04-02 NOTE — ED Notes (Signed)
Delay in lab draw,  Pt currently in MRI 

## 2018-04-02 NOTE — ED Notes (Signed)
Pt ambulated to the bathroom with 1 assist.

## 2018-04-02 NOTE — ED Notes (Signed)
Patient transported to X-ray 

## 2018-04-02 NOTE — ED Notes (Signed)
Neurology at bedside.

## 2018-04-02 NOTE — ED Notes (Signed)
Admitting at bedside 

## 2018-04-02 NOTE — ED Notes (Signed)
Pt returned from xray

## 2018-04-02 NOTE — ED Notes (Signed)
Pt returned from CT °

## 2018-04-02 NOTE — ED Triage Notes (Signed)
Pt states three weeks ago she noticed slight numbness to her extremities but she was able to walk fine. 1 week after that she woke up with numbness to her hands, legs and feet bilateral and able to walk but very unsteady and felt like she would fall. Pt states numbness and off balance has became worse. She now has chest pain that radiates to right and left side and her back. Pt also states vision has been blurry in both eyes.

## 2018-04-02 NOTE — ED Provider Notes (Signed)
[facility] EMERGENCY DEPARTMENT Provider Note   CSN: [id number] Arrival date & time: 04/02/18  1710     History   Chief Complaint Chief Complaint  Patient presents with  . Chest Pain  . Numbness    HPI Rebecca Goodman is a 42 y.o. female.  HPI   42 y.o. female presenting with a 3 week history of neurological symptoms. Symptoms began with 3 weeks ago with new onset of sensory numbness to her feet and hands, worse involving the feet. The sensory symptoms have progressed since then to involve the legs below the knees, with gradual worsening of the sensation of numbness as well as development of painful paresthesias of the hands and feet ("like its on fire"). Sensory symptoms are worse to her left hand and foot than on the right. 1 week after onset of the sensory symptoms, she began experiencing bilateral lower extremity weakness with knees "feeling wobbly" and legs feeling heavy "like walking through sand". The BLE weakness has gradually progressed to the point where her legs have given way resulting in several falls, in one of which she struck her head. She denies urinary or bowel symptoms, headache or confusion. She endorses some gradually worsening blurring of vision bilaterally, as well as chest discomfort in a band like distribution around her lower thorax. Onset of her sensory symptoms was preceded by a 2 week illness with symptoms described by patient that are most consistent with the flu. No recent vaccinations.  Past Medical History:  Diagnosis Date  . Arthralgia of hip   . Arthralgia of knee   . Obesity     Patient Active Problem List   Diagnosis Date Noted  . Wrist pain, acute, right 04/13/2017  . Hand pain, right 04/13/2017  . Tenosynovitis of hand 04/13/2017  . Numbness and tingling 07/02/2013    Past Surgical History:  Procedure Laterality Date  . WISDOM TOOTH EXTRACTION       OB History   No obstetric history on file.      Home  Medications    Prior to Admission medications   Medication Sig Start Date End Date Taking? Authorizing Provider  cyclobenzaprine (FLEXERIL) 10 MG tablet 1/2-1 tablet po QHS 11/15/17   Tysinger, Kermit Balo, PA-C  HYDROcodone-acetaminophen (NORCO) 7.5-325 MG tablet Take 1 tablet by mouth every 6 (six) hours as needed for moderate pain. 11/15/17   Tysinger, Kermit Balo, PA-C    Family History Family History  Problem Relation Age of Onset  . Hypertension Father   . Stroke Father 27  . Diabetes Mother        borderline  . Diabetes Maternal Aunt   . Diabetes Maternal Grandmother   . Diabetes Maternal Aunt   . Sudden death Neg Hx   . Heart attack Neg Hx   . Hyperlipidemia Neg Hx   . Cancer Neg Hx   . Heart disease Neg Hx     Social History Social History   Tobacco Use  . Smoking status: Current Every Day Smoker    Types: Cigarettes  . Smokeless tobacco: Never Used  Substance Use Topics  . Alcohol use: Yes    Alcohol/week: 2.0 standard drinks    Types: 1 Glasses of wine, 1 Shots of liquor per week  . Drug use: No     Allergies   Prednisone   Review of Systems Review of Systems  All systems reviewed and negative, other than as noted in HPI. Physical Exam Updated Vital Signs BP Marland Kitchen)  141/95 (BP Location: Right Arm)   Pulse (!) 115   Temp 98.5 F (36.9 C) (Oral)   Resp 20   SpO2 98%   Physical Exam Vitals signs and nursing note reviewed.  Constitutional:      General: She is not in acute distress.    Appearance: She is well-developed. She is obese. She is not ill-appearing, toxic-appearing or diaphoretic.  HENT:     Head: Normocephalic and atraumatic.  Eyes:     General:        Right eye: No discharge.        Left eye: No discharge.     Conjunctiva/sclera: Conjunctivae normal.  Neck:     Musculoskeletal: Neck supple.  Cardiovascular:     Rate and Rhythm: Normal rate and regular rhythm.     Heart sounds: Normal heart sounds. No murmur. No friction rub. No gallop.     Pulmonary:     Effort: Pulmonary effort is normal. No respiratory distress.     Breath sounds: Normal breath sounds.  Abdominal:     General: There is no distension.     Palpations: Abdomen is soft.     Tenderness: There is no abdominal tenderness.  Musculoskeletal:        General: No tenderness.  Skin:    General: Skin is warm and dry.  Neurological:     Mental Status: She is alert.     Comments: Speech clear. Oriented x4. CN 2-12 intact. Strength 5/5 b/l UE. Shaking/bobbing when holding either leg off bed. I cannot get a patellar reflex b/l.   Psychiatric:        Behavior: Behavior normal.        Thought Content: Thought content normal.      ED Treatments / Results  Labs (all labs ordered are listed, but only abnormal results are displayed) Labs Reviewed  CBC - Abnormal; Notable for the following components:      Result Value   RBC 2.84 (*)    Hemoglobin 10.3 (*)    HCT 31.3 (*)    MCV 110.2 (*)    MCH 36.3 (*)    All other components within normal limits  CBC - Abnormal; Notable for the following components:   RBC 2.81 (*)    Hemoglobin 9.9 (*)    HCT 30.8 (*)    MCV 109.6 (*)    MCH 35.2 (*)    All other components within normal limits  IRON AND TIBC - Abnormal; Notable for the following components:   TIBC 241 (*)    All other components within normal limits  FERRITIN - Abnormal; Notable for the following components:   Ferritin 467 (*)    All other components within normal limits  BASIC METABOLIC PANEL - Abnormal; Notable for the following components:   Calcium 8.6 (*)    All other components within normal limits  CBC - Abnormal; Notable for the following components:   RBC 2.54 (*)    Hemoglobin 8.9 (*)    HCT 28.1 (*)    MCV 110.6 (*)    MCH 35.0 (*)    nRBC 0.3 (*)    All other components within normal limits  FOLATE - Abnormal; Notable for the following components:   Folate 3.4 (*)    All other components within normal limits  BASIC METABOLIC PANEL  - Abnormal; Notable for the following components:   BUN 5 (*)    Calcium 8.4 (*)    All other components within  normal limits  CBC - Abnormal; Notable for the following components:   RBC 2.53 (*)    Hemoglobin 9.1 (*)    HCT 27.8 (*)    MCV 109.9 (*)    MCH 36.0 (*)    All other components within normal limits  HEPATIC FUNCTION PANEL - Abnormal; Notable for the following components:   Albumin 2.2 (*)    All other components within normal limits  BASIC METABOLIC PANEL - Abnormal; Notable for the following components:   Glucose, Bld 102 (*)    Calcium 8.7 (*)    All other components within normal limits  CBC - Abnormal; Notable for the following components:   RBC 2.35 (*)    Hemoglobin 8.6 (*)    HCT 26.1 (*)    MCV 111.1 (*)    MCH 36.6 (*)    All other components within normal limits  I-STAT BETA HCG BLOOD, ED ([facility], [facility], [facility] ONLY) - Abnormal; Notable for the following components:   I-stat hCG, quantitative 15.0 (*)    All other components within normal limits  BASIC METABOLIC PANEL  HCG, SERUM, QUALITATIVE  HIV ANTIBODY (ROUTINE TESTING W REFLEX)  CREATININE, SERUM  MAGNESIUM  VITAMIN B12  MAGNESIUM  TSH  MAGNESIUM  CBG MONITORING, ED  I-STAT TROPONIN, ED    EKG EKG Interpretation  Date/Time:  Sunday April 02 2018 17:21:21 EST Ventricular Rate:  119 PR Interval:    QRS Duration: 74 QT Interval:  338 QTC Calculation: 476 R Axis:   36 Text Interpretation:  Sinus tachycardia Baseline wander in lead(s) II III aVF Confirmed by Finnian Husted ([id number]) on 04/02/2018 6:27:34 PM   Radiology Dg Chest 2 View  Result Date: 04/02/2018 CLINICAL DATA:  Chest pain. EXAM: CHEST - 2 VIEW COMPARISON:  Radiographs of June 25, 2013. FINDINGS: The heart size and mediastinal contours are within normal limits. Both lungs are clear. No pneumothorax or pleural effusion is noted. The visualized skeletal structures are unremarkable. IMPRESSION: No active cardiopulmonary disease.  Electronically Signed   By: James  Green Jr, M.D.   On: 04/02/2018 19:37    Procedures Procedures (including critical care time)  Medications Ordered in ED Medications - No data to display   Initial Impression / Assessment and Plan / ED Course  I have reviewed the triage vital signs and the nursing notes.  Pertinent labs & imaging results that were available during my care of the patient were reviewed by me and considered in my medical decision making (see chart for details).     42 yF with progressive numbness/weakness. Concern for possible Guillain-Barre. No respiratory complaints. Will consult neurology.   Final Clinical Impressions(s) / ED Diagnoses   Final diagnoses:  Guillain Barr syndrome (HCC)  Numbness and tingling    ED Discharge Orders    None       Raeford RazorKohut, Bristyn Kulesza, MD 04/13/18 2256

## 2018-04-02 NOTE — ED Notes (Signed)
Patient transported to CT 

## 2018-04-02 NOTE — ED Notes (Signed)
Patient transported to MRI 

## 2018-04-02 NOTE — ED Notes (Signed)
ED Provider at bedside. 

## 2018-04-02 NOTE — Consult Note (Signed)
NEURO HOSPITALIST CONSULT NOTE   Requestig physician: Dr. Juleen China  Reason for Consult: Possible GBS  History obtained from:  Patient     HPI:                                                                                                                                          Rebecca Goodman is an 42 y.o. female presenting with a 3 week history of neurological symptoms. Symptoms began with 3 weeks ago with new onset of sensory numbness to her feet and hands, worse involving the feet. The sensory symptoms have progressed since then to involve the legs below the knees, with gradual worsening of the sensation of numbness as well as development of painful paresthesias of the hands and feet ("like its on fire"). Sensory symptoms are worse to her left hand and foot than on the right. 1 week after onset of the sensory symptoms, she began experiencing bilateral lower extremity weakness with knees "feeling wobbly" and legs feeling heavy "like walking through sand". The BLE weakness has gradually progressed to the point where her legs have given way resulting in several falls, in one of which she struck her head. She denies urinary or bowel symptoms, headache or confusion. She endorses some gradually worsening blurring of vision bilaterally, as well as chest discomfort in a band like distribution around her lower thorax. Onset of her sensory symptoms was preceded by a 2 week illness with symptoms described by patient that are most consistent with the flu. No recent vaccinations.   Past Medical History:  Diagnosis Date  . Arthralgia of hip   . Arthralgia of knee   . Obesity     Past Surgical History:  Procedure Laterality Date  . WISDOM TOOTH EXTRACTION      Family History  Problem Relation Age of Onset  . Hypertension Father   . Stroke Father 71  . Diabetes Mother        borderline  . Diabetes Maternal Aunt   . Diabetes Maternal Grandmother   . Diabetes Maternal Aunt   .  Sudden death Neg Hx   . Heart attack Neg Hx   . Hyperlipidemia Neg Hx   . Cancer Neg Hx   . Heart disease Neg Hx               Social History:  reports that she has been smoking cigarettes. She has never used smokeless tobacco. She reports current alcohol use of about 2.0 standard drinks of alcohol per week. She reports that she does not use drugs.  Allergies  Allergen Reactions  . Prednisone     IM, bad anxiety    MEDICATIONS:  Flexeril Norco Tylenol Ibuprofen Lidocaine-Aloe vera topical Aleve  ROS:                                                                                                                                       As per HPI.   Blood pressure (!) 153/101, pulse 95, temperature 98.5 F (36.9 C), temperature source Oral, resp. rate 20, SpO2 98 %.   General Examination:                                                                                                       Physical Exam  HEENT-  [place]/AT    Lungs-Respirations unlabored Extremities- No edema  Neurological Examination Mental Status: Alert, oriented, thought content appropriate.  Speech fluent without evidence of aphasia.  Able to follow all commands without difficulty. Cranial Nerves: II:  Fixates and tracks normally. PERRL.   III,IV, VI: EOMI with subtle saccadic quality to visual pursuits. Transient nystagmus on upgaze.  V,VII: Smile symmetric, facial temp sensation equal bilaterally VIII: hearing intact to voice IX,X: No hypophonia XI: Symmetric XII: Midline tongue extension Motor: BUE: 4+/5 proximal and distal, slightly weaker grip on the left. No drift.  BLE: 4-/5 hip flexion, knee flexion and knee extension. 4/5 ADF and plantar flexion.  No pronator drift.  Sensory: Decreased temp and FT sensation bilateral feet and hands, worse on the left. Dysesthesia and allodynia to  scratching of plantar aspects of feet bilaterally.  Deep Tendon Reflexes:  2+ bilateral brachioradialis and biceps. 0 patellae and achilles bilaterally.  Toes mute bilaterally.  Cerebellar: No ataxia with FNF bilaterally. Positive for ataxia with heel-shin bilaterally. Also with dysmetria on foot-tapping bilaterally.  Gait: Deferred   Lab Results: Basic Metabolic Panel: Recent Labs  Lab 04/02/18 1807  NA 138  K 4.7  CL 108  CO2 24  GLUCOSE 87  BUN 7  CREATININE 0.56  CALCIUM 9.1    CBC: Recent Labs  Lab 04/02/18 1807  WBC 8.0  HGB 10.3*  HCT 31.3*  MCV 110.2*  PLT 391    Cardiac Enzymes: No results for input(s): CKTOTAL, CKMB, CKMBINDEX, TROPONINI in the last 168 hours.  Lipid Panel: No results for input(s): CHOL, TRIG, HDL, CHOLHDL, VLDL, LDLCALC in the last 168 hours.  Imaging: Dg Chest 2 View  Result Date: 04/02/2018 CLINICAL DATA:  Chest pain. EXAM: CHEST - 2 VIEW COMPARISON:  Radiographs of June 25, 2013. FINDINGS: The heart size and mediastinal contours are within normal limits. Both  lungs are clear. No pneumothorax or pleural effusion is noted. The visualized skeletal structures are unremarkable. IMPRESSION: No active cardiopulmonary disease. Electronically Signed   By: Lupita RaiderJames  Green Jr, M.D.   On: 04/02/2018 19:37    Assessment: 42 year old female with progressive distal sensory symptoms and bilateral lower extremity weakness in an ascending pattern.  1. Areflexia of lower extremities on exam is noted in conjunction with distal sensory deficits and BLE weakness. Overall clinical picture most consistent with GBS. The lower extremity ataxia and brief nystagmus noted on ocular exam are suggestive of a possible overlap syndrome with the Miller-Fisher variant of GBS.  2. The patient refuses LP after risks/benefits were discussed.  3. CT head was normal.  4. Painful hand and foot paresthesias and allodynia bilaterally.   Recommendations: 1. Start IVIG 400 mg/kg  qd x 5 days. 2. Hydrate well while on IVIG.  3. Neurontin 100 mg po TID 4. MRI Lumbar spine with and without contrast. 5. Respiratory therapy for FVC and NIF x 1. If impaired, then monitor FVC and NIF qshift.  6. MRI brain with and without contrast to evaluate for possible brainstem lesion given transient nystagmus on upgaze. 7. MRI cervical spine with and without contrast. to rule out myelopathy.  8. The patient elects to not have an LP after full risks/benefits were discussed with her. She elects to be empirically treated with IVIG without additional diagnostic information from LP.    Electronically signed: Dr. Caryl PinaEric Estell Puccini 04/02/2018, 8:08 PM

## 2018-04-03 ENCOUNTER — Inpatient Hospital Stay (HOSPITAL_COMMUNITY): Payer: Self-pay

## 2018-04-03 DIAGNOSIS — D529 Folate deficiency anemia, unspecified: Secondary | ICD-10-CM

## 2018-04-03 LAB — IRON AND TIBC
Iron: 51 ug/dL (ref 28–170)
Saturation Ratios: 21 % (ref 10.4–31.8)
TIBC: 241 ug/dL — ABNORMAL LOW (ref 250–450)
UIBC: 190 ug/dL

## 2018-04-03 LAB — BASIC METABOLIC PANEL
ANION GAP: 8 (ref 5–15)
BUN: 8 mg/dL (ref 6–20)
CO2: 24 mmol/L (ref 22–32)
Calcium: 8.6 mg/dL — ABNORMAL LOW (ref 8.9–10.3)
Chloride: 106 mmol/L (ref 98–111)
Creatinine, Ser: 0.66 mg/dL (ref 0.44–1.00)
GFR calc Af Amer: >60 mL/min (ref >60)
Glucose, Bld: 99 mg/dL (ref 70–99)
POTASSIUM: 3.7 mmol/L (ref 3.5–5.1)
Sodium: 138 mmol/L (ref 135–145)

## 2018-04-03 LAB — CBC
HCT: 28.1 % — ABNORMAL LOW (ref 36.0–46.0)
HEMOGLOBIN: 8.9 g/dL — AB (ref 12.0–15.0)
MCH: 35 pg — ABNORMAL HIGH (ref 26.0–34.0)
MCHC: 31.7 g/dL (ref 30.0–36.0)
MCV: 110.6 fL — ABNORMAL HIGH (ref 80.0–100.0)
Platelets: 318 10*3/uL (ref 150–400)
RBC: 2.54 MIL/uL — ABNORMAL LOW (ref 3.87–5.11)
RDW: 15.4 % (ref 11.5–15.5)
WBC: 7.7 10*3/uL (ref 4.0–10.5)
nRBC: 0.3 % — ABNORMAL HIGH (ref 0.0–0.2)

## 2018-04-03 LAB — VITAMIN B12: Vitamin B-12: 552 pg/mL (ref 180–914)

## 2018-04-03 LAB — MAGNESIUM: MAGNESIUM: 1.7 mg/dL (ref 1.7–2.4)

## 2018-04-03 LAB — CREATININE, SERUM
Creatinine, Ser: 0.53 mg/dL (ref 0.44–1.00)
GFR calc Af Amer: >60 mL/min (ref >60)

## 2018-04-03 LAB — FOLATE: Folate: 3.4 ng/mL — ABNORMAL LOW (ref >5.9)

## 2018-04-03 LAB — FERRITIN: Ferritin: 467 ng/mL — ABNORMAL HIGH (ref 11–307)

## 2018-04-03 LAB — HCG, SERUM, QUALITATIVE: Preg, Serum: NEGATIVE

## 2018-04-03 LAB — HIV ANTIBODY (ROUTINE TESTING W REFLEX): HIV Screen 4th Generation wRfx: NONREACTIVE

## 2018-04-03 MED ORDER — ACETAMINOPHEN 325 MG PO TABS: 650.0000 mg | ORAL_TABLET | Freq: Four times a day (QID) | ORAL | Status: DC | PRN

## 2018-04-03 MED ORDER — TRAMADOL HCL 50 MG PO TABS
50.0000 mg | ORAL_TABLET | Freq: Four times a day (QID) | ORAL | Status: DC | PRN
  Administered 2018-04-03 – 2018-04-06 (×3): 50 mg via ORAL
  Filled 2018-04-03 (×3): qty 1

## 2018-04-03 MED ORDER — KETOROLAC TROMETHAMINE 30 MG/ML IJ SOLN
30.0000 mg | Freq: Once | INTRAMUSCULAR | Status: AC
  Administered 2018-04-03: 30 mg via INTRAVENOUS
  Filled 2018-04-03: qty 1

## 2018-04-03 MED ORDER — GADOBUTROL 1 MMOL/ML IV SOLN
10.0000 mL | Freq: Once | INTRAVENOUS | Status: AC | PRN
  Administered 2018-04-03: 9 mL via INTRAVENOUS

## 2018-04-03 MED ORDER — GABAPENTIN 400 MG PO CAPS
400.0000 mg | ORAL_CAPSULE | Freq: Three times a day (TID) | ORAL | Status: DC
  Administered 2018-04-03 – 2018-04-07 (×11): 400 mg via ORAL
  Filled 2018-04-03 (×11): qty 1

## 2018-04-03 MED ORDER — FOLIC ACID 5 MG/ML IJ SOLN
1.0000 mg | Freq: Every day | INTRAMUSCULAR | Status: DC
  Administered 2018-04-03 – 2018-04-06 (×4): 1 mg via INTRAVENOUS
  Filled 2018-04-03 (×4): qty 0.2

## 2018-04-03 MED ORDER — OXYCODONE HCL 5 MG PO TABS
5.0000 mg | ORAL_TABLET | ORAL | Status: DC | PRN
  Administered 2018-04-03 – 2018-04-07 (×15): 10 mg via ORAL
  Filled 2018-04-03 (×15): qty 2

## 2018-04-03 MED ORDER — GABAPENTIN 100 MG PO CAPS
200.0000 mg | ORAL_CAPSULE | Freq: Three times a day (TID) | ORAL | Status: DC
  Administered 2018-04-03: 200 mg via ORAL

## 2018-04-03 MED ORDER — TRAMADOL HCL 50 MG PO TABS
50.0000 mg | ORAL_TABLET | Freq: Once | ORAL | Status: AC
  Administered 2018-04-03: 50 mg via ORAL
  Filled 2018-04-03: qty 1

## 2018-04-03 MED ORDER — GABAPENTIN 100 MG PO CAPS
200.0000 mg | ORAL_CAPSULE | Freq: Three times a day (TID) | ORAL | Status: DC
  Filled 2018-04-03: qty 2

## 2018-04-03 MED ORDER — MAGNESIUM SULFATE 2 GM/50ML IV SOLN
2.0000 g | Freq: Once | INTRAVENOUS | Status: AC
  Administered 2018-04-03: 2 g via INTRAVENOUS
  Filled 2018-04-03: qty 50

## 2018-04-03 NOTE — Evaluation (Signed)
Physical Therapy Evaluation Patient Details Name: Burnett Harryoye Gastineau MRN: [id number] DOB: 07/29/1975 Today's Date: 04/03/2018   History of Present Illness  42 yo female admitted with onset of sensory numbness to feet and hands. pt reports preceded by 2 weeks of illness. work up at this time to rule out GBS with pending IVIG treatments. PMH:  Clinical Impression  Orders received for PT evaluation. Patient demonstrates deficits in functional mobility as indicated below. Will benefit from continued skilled PT to address deficits and maximize function. Will see as indicated and progress as tolerated.  Educated patient on sensory management techniques, patient responds well with cues. Anticipate patient will progress well, may benefit from initial use of RW but will follow for progress and recommendation updates.    Follow Up Recommendations Outpatient PT;Supervision for mobility/OOB    Equipment Recommendations  Rolling walker with 5" wheels    Recommendations for Other Services       Precautions / Restrictions Precautions Precautions: Fall Precaution Comments: reports falls at home      Mobility  Bed Mobility Overal bed mobility: Needs Assistance Bed Mobility: Supine to Sit     Supine to sit: Supervision     General bed mobility comments: increased time and effort, no physical assist required  Transfers Overall transfer level: Needs assistance   Transfers: Sit to/from Stand Sit to Stand: Min guard         General transfer comment: min guard from bed and from toilet with increased time and effort, heavy reliance on external support to power up  Ambulation/Gait Ambulation/Gait assistance: Min guard Gait Distance (Feet): 16 Feet Assistive device: Rolling walker (2 wheeled) Gait Pattern/deviations: Step-through pattern;Decreased stride length;Wide base of support;Drifts right/left Gait velocity: decreased   General Gait Details: Reliance on RW for UE support and  stability  Stairs            Wheelchair Mobility    Modified Rankin (Stroke Patients Only)       Balance Overall balance assessment: Mild deficits observed, not formally tested                                           Pertinent Vitals/Pain Pain Assessment: 0-10 Pain Score: 8  Pain Location: hands and feet Pain Descriptors / Indicators: Constant;Burning;Throbbing    Home Living Family/patient expects to be discharged to:: Private residence Living Arrangements: Children(daughter but is away a college [employer])   Type of Home: House Home Access: Stairs to enter Entrance Stairs-Rails: Left Entrance Stairs-Number of Steps: 7 Home Layout: One level Home Equipment: None      Prior Function Level of Independence: Independent               Hand Dominance   Dominant Hand: Right    Extremity/Trunk Assessment   Upper Extremity Assessment Upper Extremity Assessment: RUE deficits/detail;LUE deficits/detail RUE Deficits / Details: chnages to the hands and reports the palm is the most painful  LUE Deficits / Details: chnages to the hands and reports the palm is the most painful    Lower Extremity Assessment Lower Extremity Assessment: RLE deficits/detail;LLE deficits/detail RLE Sensation: decreased light touch LLE Sensation: decreased light touch(LLE < R LE)    Cervical / Trunk Assessment Cervical / Trunk Assessment: Other exceptions(back pain )  Communication   Communication: No difficulties  Cognition Arousal/Alertness: Awake/alert Behavior During Therapy: WFL for tasks assessed/performed Overall Cognitive Status: Within  Functional Limits for tasks assessed                                        General Comments General comments (skin integrity, edema, etc.): BP 144/128    Exercises     Assessment/Plan    PT Assessment Patient needs continued PT services  PT Problem List Decreased strength;Decreased activity  tolerance;Decreased balance;Decreased mobility;Impaired sensation;Pain       PT Treatment Interventions DME instruction;Gait training;Functional mobility training;Therapeutic activities;Balance training;Stair training;Neuromuscular re-education;Therapeutic exercise;Patient/family education    PT Goals (Current goals can be found in the Care Plan section)  Acute Rehab PT Goals Patient Stated Goal: to get back to doing PT Goal Formulation: With patient Time For Goal Achievement: 04/17/18 Potential to Achieve Goals: Good    Frequency Min 3X/week   Barriers to discharge Decreased caregiver support      Co-evaluation PT/OT/SLP Co-Evaluation/Treatment: Yes Reason for Co-Treatment: For patient/therapist safety PT goals addressed during session: Mobility/safety with mobility;Balance OT goals addressed during session: ADL's and self-care       AM-PAC PT "6 Clicks" Mobility  Outcome Measure Help needed turning from your back to your side while in a flat bed without using bedrails?: A Little Help needed moving from lying on your back to sitting on the side of a flat bed without using bedrails?: A Little Help needed moving to and from a bed to a chair (including a wheelchair)?: A Little Help needed standing up from a chair using your arms (e.g., wheelchair or bedside chair)?: A Little Help needed to walk in hospital room?: A Little Help needed climbing 3-5 steps with a railing? : A Little 6 Click Score: 18    End of Session Equipment Utilized During Treatment: Gait belt Activity Tolerance: Patient tolerated treatment well Patient left: in bed;with call bell/phone within reach Nurse Communication: Mobility status PT Visit Diagnosis: Unsteadiness on feet (R26.81);Difficulty in walking, not elsewhere classified (R26.2);Other symptoms and signs involving the nervous system (R29.898)    Time: [id number] PT Time Calculation (min) (ACUTE ONLY): 24 min   Charges:   PT Evaluation $PT Eval  Moderate Complexity: 1 Mod          Charlotte Crumbevon Spero Gunnels, PT DPT  Board Certified Neurologic Specialist Acute Rehabilitation Services Pager [phone] Office [phone]   Fabio AsaDevon J Guthrie Lemme 04/03/2018, 3:54 PM

## 2018-04-03 NOTE — H&P (Signed)
History and Physical  Rebecca Harryoye Horrell VQQ:[id number] DOB: 06/18/1975 DOA: 04/02/2018 1711  Referring physician: Marylen PontoKohut S [facility]) PCP: Jac Canavanysinger, David S, PA-C   HISTORY   Chief Complaint: leg weakness, skin numbness, hand numbness and tingling  HPI: Rebecca Goodman is a 42 y.o. female with hx of herniated disc and intermittent back pain + sciatica who presented with subacute leg weakness, paresthesias in her hands bilaterally and numbness in her feet; and numbness up to her lower chest ongoing for past 4 weeks. Patient reports having a viral URI illness just to prior to Thanksgiving (sore throat, cough, some rhinorrhea). About 1-2 weeks after URI, she began noticing leg numbness and "pins and needles" sensation in her feet and legs. Over the next few weeks leading up to admission, she began developing episodes of acute tenderness and similar pins and needles sensation over her hands. Her legs became weak to the point that she is now having difficulty walking and had a few mechanical falls due to tripping. In the past few days, has noticed chest tightness in a band-like distribution over her lower chest at the edge of her lower sternum. Denies bowel or bladder incontinence or urinary rentention. She also has had episodes of bilateral visual blurring over the past week. Denies flu vaccinations or other recent vaccinations this year.   Regarding her herniated disc history, last episode of back pain with sciatica (numbness down both legs) was in August which occurred after she helped her daughter move. No prior lumbar MRI or imaging. She reports that symptoms improved with flexeril and ibuprofen. Has never had upper extremity sensory deficits or leg weakness to the point of not being able to ambulate.   Review of Systems:  - no fevers/chills - no cough - no chest pain, dyspnea on exertion - no edema, PND, orthopnea - no nausea/vomiting; no tarry, melanotic or bloody stools - no dysuria, increased  urinary frequency - no weight changes  Rest of systems reviewed are negative, except as per above history.   ED course:  Vitals Blood pressure 131/61, pulse 88, temperature 98.5 F (36.9 C), temperature source Oral, resp. rate 15, weight 92.1 kg, SpO2 100 %. Received   Past Medical History:  Diagnosis Date  . Arthralgia of hip   . Arthralgia of knee   . Obesity    Past Surgical History:  Procedure Laterality Date  . WISDOM TOOTH EXTRACTION      Social History:  reports that she has been smoking cigarettes. She has never used smokeless tobacco. She reports current alcohol use of about 2.0 standard drinks of alcohol per week. She reports that she does not use drugs.  Allergies  Allergen Reactions  . Prednisone Other (See Comments)    IM injection caused severe anxiety, felt like heart attack    Family History  Problem Relation Age of Onset  . Hypertension Father   . Stroke Father 1961  . Diabetes Mother        borderline  . Diabetes Maternal Aunt   . Diabetes Maternal Grandmother   . Diabetes Maternal Aunt   . Sudden death Neg Hx   . Heart attack Neg Hx   . Hyperlipidemia Neg Hx   . Cancer Neg Hx   . Heart disease Neg Hx       Prior to Admission medications   Medication Sig Start Date End Date Taking? Authorizing Provider  acetaminophen (TYLENOL) 650 MG CR tablet Take 1,300 mg by mouth every 8 (eight) hours  as needed for pain.   Yes [provider]  ibuprofen (ADVIL,MOTRIN) 200 MG tablet Take 800 mg by mouth every 6 (six) hours as needed (pain).   Yes [provider]  Lidocaine-Aloe Vera (ALOE VERA/LIDOCAINE EX) Apply 1 application topically daily as needed (hand pain).   Yes [provider]  naproxen sodium (ALEVE) 220 MG tablet Take 220 mg by mouth every 6 (six) hours as needed (pain).   Yes [provider]  cyclobenzaprine (FLEXERIL) 10 MG tablet 1/2-1 tablet po QHS Patient not taking: Reported on 04/02/2018 11/15/17   Tysinger,  Kermit Balo, PA-C  HYDROcodone-acetaminophen (NORCO) 7.5-325 MG tablet Take 1 tablet by mouth every 6 (six) hours as needed for moderate pain. Patient not taking: Reported on 04/02/2018 11/15/17   Tysinger, Kermit Balo, PA-C    PHYSICAL EXAM   Temp:  [98.5 F (36.9 C)] 98.5 F (36.9 C) (12/29 1723) Pulse Rate:  [88-115] 88 (12/29 2100) Resp:  [15-22] 15 (12/29 2100) BP: (131-153)/(61-101) 131/61 (12/29 2100) SpO2:  [98 %-100 %] 100 % (12/29 2100) Weight:  [92.1 kg] 92.1 kg (12/29 2255)  BP 131/61   Pulse 88   Temp 98.5 F (36.9 C) (Oral)   Resp 15   Wt 92.1 kg   SpO2 100%   BMI 35.96 kg/m    GEN well-nourished middle-aged african-american female; resting in bed, appears mildly uncomfortable  HEENT NCAT EOM intact PERRL; clear oropharynx, no cervical LAD; moist mucus membranes  JVP estimated 4 cm H2O above RA; no HJR ; no carotid bruits b/l ;  CV regular normal rate; normal S1 and S2; no m/r/g or S3/S4; PMI non displaced; no parasternal heave  RESP CTA b/l; breathing unlabored and symmetric; good air movement to the bases  ABD soft NT ND +normoactive BS  EXT warm throughout b/l; no peripheral edema b/l  PULSES  DP and radials 2+ intact b/l  SKIN/MSK no rashes or lesions  NEURO/PSYCH AAOx4; - No facial droop - no facial numbness - absent patellar reflex b/l - pupils equal and reactive; no visual field deficits - bilateral lower leg weakness on leg raise test - 4/5 plantarflexion b/l - sensory numbness to T6/T7 level at subxiphoid level - numbness at both hands up to wrists (dorsal hands with more pronounced paresthesia)  - proprioception abnormal with toe raise test bilaterally (2 out of 3 correct on both sides)   DATA   LABS ON ADMISSION:  Basic Metabolic Panel: Recent Labs  Lab 04/02/18 1807 04/02/18 2320  NA 138  --   K 4.7  --   CL 108  --   CO2 24  --   GLUCOSE 87  --   BUN 7  --   CREATININE 0.56 0.53  CALCIUM 9.1  --    CBC: Recent Labs  Lab  04/02/18 1807 04/02/18 2320  WBC 8.0 8.2  HGB 10.3* 9.9*  HCT 31.3* 30.8*  MCV 110.2* 109.6*  PLT 391 355   Liver Function Tests: No results for input(s): AST, ALT, ALKPHOS, BILITOT, PROT, ALBUMIN in the last 168 hours. No results for input(s): LIPASE, AMYLASE in the last 168 hours. No results for input(s): AMMONIA in the last 168 hours. Coagulation:  No results found for: INR, PROTIME No results found for: PTT Lactic Acid, Venous:  No results found for: LATICACIDVEN Cardiac Enzymes: No results for input(s): CKTOTAL, CKMB, CKMBINDEX, TROPONINI in the last 168 hours. Urinalysis:    Component Value Date/Time   COLORURINE YELLOW 07/16/2013 1050  APPEARANCEUR CLEAR 07/16/2013 1050   LABSPEC 1.005 07/16/2013 1050   PHURINE 6.0 07/16/2013 1050   GLUCOSEU NEGATIVE 07/16/2013 1050   HGBUR NEGATIVE 07/16/2013 1050   BILIRUBINUR neg 10/29/2013 1032   KETONESUR NEGATIVE 07/16/2013 1050   PROTEINUR neg 10/29/2013 1032   PROTEINUR NEGATIVE 07/16/2013 1050   UROBILINOGEN negative 10/29/2013 1032   UROBILINOGEN 0.2 07/16/2013 1050   NITRITE neg 10/29/2013 1032   NITRITE NEGATIVE 07/16/2013 1050   LEUKOCYTESUR Negative 10/29/2013 1032    BNP (last 3 results) No results for input(s): PROBNP in the last 8760 hours. CBG: Recent Labs  Lab 04/02/18 1725  GLUCAP 79    Radiological Exams on Admission: Dg Chest 2 View  Result Date: 04/02/2018 CLINICAL DATA:  Chest pain. EXAM: CHEST - 2 VIEW COMPARISON:  Radiographs of June 25, 2013. FINDINGS: The heart size and mediastinal contours are within normal limits. Both lungs are clear. No pneumothorax or pleural effusion is noted. The visualized skeletal structures are unremarkable. IMPRESSION: No active cardiopulmonary disease. Electronically Signed   By: Lupita Raider, M.D.   On: 04/02/2018 19:37   Ct Head Wo Contrast  Result Date: 04/02/2018 CLINICAL DATA:  Inflammatory polyneuropathy with numbness in the extremities and blurriness  of the IIS. EXAM: CT HEAD WITHOUT CONTRAST TECHNIQUE: Contiguous axial images were obtained from the base of the skull through the vertex without intravenous contrast. COMPARISON:  None. FINDINGS: BRAIN: The ventricles and sulci are normal. No intraparenchymal hemorrhage, mass effect nor midline shift. No acute large vascular territory infarcts. Grey-white matter distinction is maintained. The basal ganglia are unremarkable. No abnormal extra-axial fluid collections. Basal cisterns are not effaced and midline. The brainstem and cerebellar hemispheres are without acute abnormalities. VASCULAR: Unremarkable. SKULL/SOFT TISSUES: No skull fracture. No significant soft tissue swelling. ORBITS/SINUSES: The included ocular globes and orbital contents are normal.The mastoid air cells are clear. The included paranasal sinuses are well-aerated. OTHER: None. IMPRESSION: Normal head CT. Electronically Signed   By: Tollie Eth M.D.   On: 04/02/2018 20:17   Mr Lumbar Spine W Wo Contrast  Result Date: 04/02/2018 CLINICAL DATA:  Extremity numbness.  Difficulty walking. EXAM: MRI LUMBAR SPINE WITHOUT AND WITH CONTRAST TECHNIQUE: Multiplanar and multiecho pulse sequences of the lumbar spine were obtained without and with intravenous contrast. CONTRAST:  10 mL Gadavist COMPARISON:  None. FINDINGS: Segmentation: Normal. The lowest disc space is considered to be L5-S1. Alignment:  Normal Vertebrae: No acute compression fracture, discitis-osteomyelitis of focal marrow lesion. Conus medullaris and cauda equina: The conus medullaris terminates at the L1 level. There is no clumping or enlargement of the cauda equina nerve roots. There is faint enhancement on postcontrast imaging, greater on the left. Paraspinal and other soft tissues: The visualized aorta, IVC and iliac vessels are normal. The visualized retroperitoneal organs and paraspinal soft tissues are normal. Disc levels: Sagittal plane imaging includes the T10-11 disc level  through the upper sacrum, with axial imaging of the T11-12 to L5-S1 disc levels. The T10-L4 levels are normal. L4-5: Disc desiccation with small bulge. No spinal canal or neural foraminal stenosis. L5-S1: Normal. The visualized portion of the sacrum is normal. IMPRESSION: 1. Faint contrast enhancement of the cauda equina nerve roots without clumping or enlargement. The findings are equivocal, but compatible with an autoimmune neuropathy such as Guillain-Barre syndrome in the reported clinical context. 2. No spinal canal stenosis or neural impingement. Electronically Signed   By: Deatra Robinson M.D.   On: 04/02/2018 23:40    EKG: Independently reviewed.  Sinus tachycardia at 119 bpm  I have reviewed the patient's previous electronic chart records, labs, and other data.   ASSESSMENT AND PLAN   Assessment: Rebecca Goodman is a 42 y.o. female with prior history of lower back pain due to herniated disc who presents with subacute worsening bilateral lower extremity weakness as well as upper extremity parasthesias and ascending sensory deficits preceded by URI like illness. No respiratory issues reported and no concern for respiratory failure on exam. Neurology has already been consulted and recommending starting IVIG. Lumbar, cervical, and brain MRI are pending.    Active Problems:   Guillain Barr syndrome [facility](HCC)  Plan:   # Progressive ascending weakness, sensory deficits, and parasthesia concerning for GBS /AIDP in setting of recent viral URI illness. No recent vaccinations.  > no resp failure - neurology following - IVIG initiation as per neurology (D1 12/29 evening) - lumbar/cervical/brain MRI pending - gabapentin started for parasthesias at 100mg  TID (may uptitrate per sx) - toradol 15mg  IV x 1 for pain - tylenol prn - patient seemed ambivalent re: spinal tap on my discussion; pending MRI results may be worth re-discussing - q4h neurochecks - continuous pulse ox and telemetry - PT and OT  evaluation ordered  # Anemia  (macrocytic) Hb 10.3 on admission. Prior Hb 12.7 in 2015  - iron and ferritin pending  - vitamin b12 and folate level pending  - consider empirically starting B12 and folate supplementation after labs drawn  DVT Prophylaxis: lovenox Code Status:  Full Code Family Communication: discussed with patient and partner at bedside  Disposition Plan: admit to stepdown; neuro workup for GBS   Patient contact: Extended Emergency Contact Information Primary Emergency Contact: Johnson,Clevette  [place] Home Phone: [phone] Relation: Relative  Time spent: > 35 mins  Ike Benearolyn J Park, MD [facility] Pager [phone]  If 7PM-7AM, please contact night-coverage [email or web address] Password [email or web address] 04/03/2018, 12:08 AM

## 2018-04-03 NOTE — Progress Notes (Signed)
VC: 2.1 NIF -40

## 2018-04-03 NOTE — Evaluation (Addendum)
Occupational Therapy Evaluation Patient Details Name: Rebecca Goodman MRN: [id number] DOB: 10/06/1975 Today's Date: 04/03/2018    History of Present Illness 42 yo female admitted with onset of sensory numbness to feet and hands. pt reports preceded by 2 weeks of illness. work up at this time to rule out GBS with pending IVIG treatments. PMH:  Past Medical History:  Diagnosis Date  . Arthralgia of hip   . Arthralgia of knee   . Obesity      Clinical Impression   PT admitted with see above. Pt currently with functional limitiations due to the deficits listed below (see OT problem list). Pt currently with balance deficits affect adls and sensation changes in hands that make it painful to complete adls. Pt is able to use the cell phone with finger tip tapping.  Pt will benefit from skilled OT to increase their independence and safety with adls and balance to allow discharge outpatient OT pending progress.     Follow Up Recommendations  Outpatient OT    Equipment Recommendations  None recommended by OT    Recommendations for Other Services       Precautions / Restrictions Precautions Precautions: Fall Precaution Comments: reports falls at home      Mobility Bed Mobility Overal bed mobility: Needs Assistance Bed Mobility: Supine to Sit     Supine to sit: Supervision     General bed mobility comments: increased time and effort, no physical assist required  Transfers Overall transfer level: Needs assistance   Transfers: Sit to/from Stand Sit to Stand: Min guard         General transfer comment: min guard from bed and from toilet with increased time and effort, heavy reliance on external support to power up    Balance Overall balance assessment: Mild deficits observed, not formally tested                                         ADL either performed or assessed with clinical judgement   ADL Overall ADL's : Needs assistance/impaired     Grooming:  Wash/dry hands;Min guard;Standing   Upper Body Bathing: Min guard   Lower Body Bathing: Min guard   Upper Body Dressing : Min guard   Lower Body Dressing: Min guard   Toilet Transfer: Minimal assistance   Toileting- Clothing Manipulation and Hygiene: Min guard       Functional mobility during ADLs: Min guard;Rolling walker General ADL Comments: pt able to push down on toilet to flush, pt able to pull up and down underwear. pt able to push down on RW with palms only. Recommedn possible trial of platforms on RW if continue to be limited in the hands for stability     Vision Baseline Vision/History: No visual deficits Patient Visual Report: Blurring of vision Additional Comments: reports blurred vision for awhile now     Perception     Praxis      Pertinent Vitals/Pain Pain Assessment: 0-10 Pain Score: 8  Pain Location: hands and feet Pain Descriptors / Indicators: Constant;Burning;Throbbing     Hand Dominance Right   Extremity/Trunk Assessment Upper Extremity Assessment Upper Extremity Assessment: RUE deficits/detail;LUE deficits/detail RUE Deficits / Details: chnages to the hands and reports the palm is the most painful  LUE Deficits / Details: chnages to the hands and reports the palm is the most painful   Lower Extremity Assessment Lower Extremity Assessment:  RLE deficits/detail;LLE deficits/detail RLE Sensation: decreased light touch LLE Sensation: decreased light touch(LLE < R LE)   Cervical / Trunk Assessment Cervical / Trunk Assessment: Other exceptions(back pain )   Communication Communication Communication: No difficulties   Cognition Arousal/Alertness: Awake/alert Behavior During Therapy: WFL for tasks assessed/performed Overall Cognitive Status: Within Functional Limits for tasks assessed                                     General Comments  BP 144/128    Exercises     Shoulder Instructions      Home Living Family/patient  expects to be discharged to:: Private residence Living Arrangements: Children(daughter but is away a college [employer])   Type of Home: House Home Access: Stairs to enter Secretary/administratorntrance Stairs-Number of Steps: 7 Entrance Stairs-Rails: Left Home Layout: One level     Bathroom Shower/Tub: Chief Strategy OfficerTub/shower unit   Bathroom Toilet: Standard     Home Equipment: None          Prior Functioning/Environment Level of Independence: Independent                 OT Problem List: Decreased strength;Decreased activity tolerance;Impaired balance (sitting and/or standing);Decreased coordination;Decreased safety awareness;Decreased knowledge of use of DME or AE;Decreased knowledge of precautions;Impaired sensation      OT Treatment/Interventions: Self-care/ADL training;Therapeutic exercise;Neuromuscular education;Energy conservation;DME and/or AE instruction;Manual therapy;Therapeutic activities;Patient/family education;Balance training    OT Goals(Current goals can be found in the care plan section) Acute Rehab OT Goals Patient Stated Goal: to get back to doing OT Goal Formulation: With patient Time For Goal Achievement: 04/17/18 Potential to Achieve Goals: Good ADL Goals Pt Will Perform Grooming: with modified independence Pt Will Perform Upper Body Bathing: with modified independence Pt Will Transfer to Toilet: with modified independence  OT Frequency: Min 2X/week   Barriers to D/C: Decreased caregiver support  lives alone        Co-evaluation PT/OT/SLP Co-Evaluation/Treatment: Yes Reason for Co-Treatment: For patient/therapist safety   OT goals addressed during session: ADL's and self-care      AM-PAC OT "6 Clicks" Daily Activity     Outcome Measure Help from another person eating meals?: A Little Help from another person taking care of personal grooming?: A Little Help from another person toileting, which includes using toliet, bedpan, or urinal?: A Little Help from another person  bathing (including washing, rinsing, drying)?: A Little Help from another person to put on and taking off regular upper body clothing?: A Little Help from another person to put on and taking off regular lower body clothing?: A Little 6 Click Score: 18   End of Session Equipment Utilized During Treatment: Rolling walker;Gait belt Nurse Communication: Mobility status;Precautions  Activity Tolerance: Patient tolerated treatment well Patient left: in bed;with call bell/phone within reach;with chair alarm set  OT Visit Diagnosis: Unsteadiness on feet (R26.81);Repeated falls (R29.6)                Time: [id number] OT Time Calculation (min): 24 min Charges:  OT General Charges $OT Visit: 1 Visit OT Evaluation $OT Eval Moderate Complexity: 1 Mod   Mateo FlowBrynn Leoma Folds, OTR/L  Acute Rehabilitation Services Pager: [phone] Office: [phone] .   Mateo FlowBrynn Azyria Osmon 04/03/2018, 3:11 PM

## 2018-04-03 NOTE — Progress Notes (Addendum)
NEUROLOGY PROGRESS NOTE  Subjective: At this point patient does not feel any better or worse.  Continues to have decreased sensation in bilateral legs just above knees and also dysesthesias and both hands and the bottom of her feet.  She also states that she has some numbness on both lateral sides just below T4 T3 region but it is only very localized.  Exam: Vitals:   04/03/18 0557 04/03/18 0918  BP: (!) 149/96 (!) 149/97  Pulse: 100 92  Resp: 19 18  Temp: 98.8 F (37.1 C) 98.4 F (36.9 C)  SpO2: 99% 97%    Physical Exam   HEENT-  Normocephalic, no lesions, without obvious abnormality.  Normal external eye and conjunctiva.   Extremities- Warm, dry and intact Musculoskeletal-no joint tenderness, deformity or swelling Skin-warm and dry, no hyperpigmentation, vitiligo, or suspicious lesions    Neuro:  Mental Status: Alert, oriented, thought content appropriate.  Speech fluent without evidence of aphasia.  Able to follow 3 step commands without difficulty. Cranial Nerves: II:  Visual fields grossly normal,  III,IV, VI: ptosis not present, extra-ocular motions intact bilaterally pupils equal, round, reactive to light and accommodation V,VII: smile symmetric, facial light touch sensation normal bilaterally VIII: hearing normal bilaterally IX,X: uvula rises midline XI: bilateral shoulder shrug XII: midline tongue extension Motor: Bilateral arms are 5/5 however bilateral hand flexion is 3/5 but 4/5 when distracted.  The majority of the weakness secondary to pain.  Bilateral hip flexion is 5/5 knee extension is 5/5, right knee flexion is 4/5, left knee flexion is 4-/5 with dorsiflexion 4/5 plantarflexion 3/5 secondary to pain. Sensory: Complains of decreased sensation and pain in bilateral hands.  Decreased sensation from feet up to approximately 3 inches above her knee.  She describes it as if there was a paper film over her leg between my hand and her leg.  By a lateral plantar  surfaces have pain that is described as if she scraped the complete aspect of her plantar surface.  Continues to have abnormal proprioception. Deep Tendon Reflexes: 2+ upper extremities with no knee jerk or ankle jerk Plantars: Right: downgoing   Left: downgoing Cerebellar: No ataxia was noted today in the upper extremities.  Mild ataxia in the lower extremities most likely secondary to decreased strength.     Medications:  Scheduled: . enoxaparin (LOVENOX) injection  40 mg Subcutaneous Q24H  . gabapentin  100 mg Oral TID  . Immune Globulin 10%  400 mg/kg Intravenous Q24 Hr x 5   Continuous:  WUJ:[id number]:acetaminophen **OR** acetaminophen  Pertinent Labs/Diagnostics:   Ct Head Wo Contrast Result Date: 04/02/2018  IMPRESSION: Normal head CT. Electronically Signed   By: Tollie Ethavid  Kwon M.D.   On: 04/02/2018 20:17   Mr Laqueta JeanBrain W ZHWo Contrast Result Date: 04/03/2018  IMPRESSION: 1. Unremarkable appearance of the brain. 2. Motion degraded cervical spine MRI. No definite spinal cord abnormality. 3. Small C4-5 disc extrusion with mild right-sided spinal stenosis. Electronically Signed   By: Sebastian AcheAllen  Grady M.D.   On: 04/03/2018 09:35   Mr Cervical Spine W Wo Contrast Result Date: 04/03/2018  IMPRESSION: 1. Unremarkable appearance of the brain. 2. Motion degraded cervical spine MRI. No definite spinal cord abnormality. 3. Small C4-5 disc extrusion with mild right-sided spinal stenosis. Electronically Signed   By: Sebastian AcheAllen  Grady M.D.   On: 04/03/2018 09:35   Mr Lumbar Spine W Wo Contrast Result Date: 04/02/2018  IMPRESSION: 1. Faint contrast enhancement of the cauda equina nerve roots without clumping or enlargement. The  findings are equivocal, but compatible with an autoimmune neuropathy such as Guillain-Barre syndrome in the reported clinical context. 2. No spinal canal stenosis or neural impingement. Electronically Signed   By: Deatra RobinsonKevin  Herman M.D.   On: 04/02/2018 23:40     Felicie MornDavid Smith PA-C [facility] [phone]  I have seen the patient reviewed the above note. She is areflexic with a distal symmetric sensation loss and mild weakness.  MRI reviewed with no acute spinal lesion to explain her symptoms  Assessment:  42 year old female with exam and symptoms most consistent with a distal symmetric polyneuropathy.  With the acuity onset this makes Guilian-Barr syndrome very likely.  Unfortunately, the patient refused LP which is often helpful in this scenario, but without other comorbidities I think the GBS is much more likely than mimics.  Recommendations: -Continue IVIG -Continue with NIF& vital capacities -Continue with PT and OT   Ritta SlotMcNeill Lenix Benoist, MD [facility] [phone]  If 7pm- 7am, please page neurology on call as listed in AMION.  04/03/2018, 11:59 AM

## 2018-04-03 NOTE — Progress Notes (Addendum)
  [facility] TEAM 1 - Stepdown/ICU TEAM  Burnett Harryoye Marmo  YNW:[id number] DOB: 08/14/1975 DOA: 04/02/2018 PCP: Jac Canavanysinger, David S, PA-C    Brief Narrative:  42 y.o. female with a hx of herniated disc and intermittent back pain/sciatica who presented with subacute leg weakness, paresthesias in her hands bilaterally, numbness in her feet, and numbness up to her lower chest for 4 weeks. She had a viral URI just prior to Thanksgiving (sore throat, cough, some rhinorrhea). About 1-2 weeks after the URI she began noticing leg numbness and "pins and needles" sensation in her feet and legs. Leading up to admission she began developing episodes of acute tenderness and similar pins and needles sensation over her hands. Her legs became weak to the point that she was having difficulty walking and had a few mechanical falls due to tripping.   Significant Events: 12/30 admit   Subjective: Pt is seen for a f/u visit.    Assessment & Plan:  Progressive ascending weakness, sensory deficits, and parasthesia - ?GBS IVIG per Neurology   Macrocytic Anemia - Folic acid deficiency  Hgb 65.710.3 on admission  Hypomagnesemia   DVT prophylaxis: lovenox  Code Status: FULL CODE Family Communication:  Disposition Plan: SDU  Consultants:  Neurology   Antimicrobials:  None    Objective: Blood pressure 137/87, pulse 87, temperature 98.3 F (36.8 C), temperature source Oral, resp. rate 19, height 5\' 3"  (1.6 m), weight 95.6 kg, SpO2 95 %.  Intake/Output Summary (Last 24 hours) at 04/03/2018 1542 Last data filed at 04/03/2018 0610 Gross per 24 hour  Intake 0 ml  Output -  Net 0 ml   Filed Weights   04/02/18 2255 04/03/18 0557  Weight: 92.1 kg 95.6 kg    Examination: Pt was seen for a f/u visit.    CBC: Recent Labs  Lab 04/02/18 1807 04/02/18 2320 04/03/18 0407  WBC 8.0 8.2 7.7  HGB 10.3* 9.9* 8.9*  HCT 31.3* 30.8* 28.1*  MCV 110.2* 109.6* 110.6*  PLT 391 355 318   Basic Metabolic Panel: Recent  Labs  Lab 04/02/18 1807 04/02/18 2320 04/03/18 0407  NA 138  --  138  K 4.7  --  3.7  CL 108  --  106  CO2 24  --  24  GLUCOSE 87  --  99  BUN 7  --  8  CREATININE 0.56 0.53 0.66  CALCIUM 9.1  --  8.6*  MG  --   --  1.7   GFR: Estimated Creatinine Clearance: 100.8 mL/min (by C-G formula based on SCr of 0.66 mg/dL).  Liver Function Tests: No results for input(s): AST, ALT, ALKPHOS, BILITOT, PROT, ALBUMIN in the last 168 hours. No results for input(s): LIPASE, AMYLASE in the last 168 hours. No results for input(s): AMMONIA in the last 168 hours.   CBG: Recent Labs  Lab 04/02/18 1725  GLUCAP 79     Scheduled Meds: . enoxaparin (LOVENOX) injection  40 mg Subcutaneous Q24H  . gabapentin  100 mg Oral TID  . Immune Globulin 10%  400 mg/kg Intravenous Q24 Hr x 5     LOS: 1 day   Time spent: No Charge  Lonia BloodJeffrey T. McClung, MD [facility] Office  [phone] Pager - Text Page per Amion as per below:  On-Call/Text Page:      [email or web address]  If 7PM-7AM, please contact night-coverage [email or web address] 04/03/2018, 3:42 PM

## 2018-04-04 ENCOUNTER — Encounter (HOSPITAL_COMMUNITY): Payer: Self-pay | Admitting: *Deleted

## 2018-04-04 LAB — CBC
HCT: 27.8 % — ABNORMAL LOW (ref 36.0–46.0)
Hemoglobin: 9.1 g/dL — ABNORMAL LOW (ref 12.0–15.0)
MCH: 36 pg — ABNORMAL HIGH (ref 26.0–34.0)
MCHC: 32.7 g/dL (ref 30.0–36.0)
MCV: 109.9 fL — AB (ref 80.0–100.0)
Platelets: 317 10*3/uL (ref 150–400)
RBC: 2.53 MIL/uL — ABNORMAL LOW (ref 3.87–5.11)
RDW: 15 % (ref 11.5–15.5)
WBC: 6.7 10*3/uL (ref 4.0–10.5)
nRBC: 0 % (ref 0.0–0.2)

## 2018-04-04 LAB — BASIC METABOLIC PANEL
Anion gap: 8 (ref 5–15)
BUN: 5 mg/dL — ABNORMAL LOW (ref 6–20)
CHLORIDE: 107 mmol/L (ref 98–111)
CO2: 24 mmol/L (ref 22–32)
Calcium: 8.4 mg/dL — ABNORMAL LOW (ref 8.9–10.3)
Creatinine, Ser: 0.68 mg/dL (ref 0.44–1.00)
GFR calc Af Amer: >60 mL/min (ref >60)
GFR calc non Af Amer: >60 mL/min (ref >60)
Glucose, Bld: 91 mg/dL (ref 70–99)
Potassium: 4 mmol/L (ref 3.5–5.1)
Sodium: 139 mmol/L (ref 135–145)

## 2018-04-04 LAB — HEPATIC FUNCTION PANEL
ALT: 13 U/L (ref 0–44)
AST: 20 U/L (ref 15–41)
Albumin: 2.2 g/dL — ABNORMAL LOW (ref 3.5–5.0)
Alkaline Phosphatase: 73 U/L (ref 38–126)
Bilirubin, Direct: 0.1 mg/dL (ref 0.0–0.2)
Indirect Bilirubin: 0.5 mg/dL (ref 0.3–0.9)
Total Bilirubin: 0.6 mg/dL (ref 0.3–1.2)
Total Protein: 7.1 g/dL (ref 6.5–8.1)

## 2018-04-04 LAB — MAGNESIUM: Magnesium: 2 mg/dL (ref 1.7–2.4)

## 2018-04-04 LAB — TSH: TSH: 2.037 u[IU]/mL (ref 0.350–4.500)

## 2018-04-04 NOTE — Progress Notes (Signed)
VC 1.9 NIF -40

## 2018-04-04 NOTE — Progress Notes (Signed)
Subjective: Still without significant change, though she notes a little bit more itching in her hands  Exam: Vitals:   04/04/18 1100 04/04/18 1625  BP: (!) 144/96 (!) 151/97  Pulse:  99  Resp: 17 (!) 27  Temp: 98.5 F (36.9 C)   SpO2: 100% 95%   Gen: In bed, NAD Resp: non-labored breathing, no acute distress Abd: soft, nt  Neuro: MS: Awake, alert, interactive and appropriate CN: Pupils equal round and reactive to light, I question if she has mild involvement of the left 4th nerve, but no diplopia just "blurred vision" Motor: She has mild weakness of plantar/dorsiflexion, as well as hand weakness.  Good proximal strength in both the arms and legs Sensory: Decreased to temperature to the mid calf, decreased light touch to the ankle  Pertinent Labs: Creatinine 0.68  Impression: 42 year old female with progressive distal numbness/weakness and areflexia consistent with GBS.  Lumbar puncture was recommended the patient, however the patient refused.  She is being treated with empiric IVIG.  Recommendations: 1) continue IVIG, third dose tonight 2) neurology will continue to follow  Ritta SlotMcNeill Kirkpatrick, MD [facility] [phone]  If 7pm- 7am, please page neurology on call as listed in AMION.

## 2018-04-04 NOTE — Progress Notes (Signed)
Physical Therapy Treatment Patient Details Name: Rebecca Goodman MRN: [id number] DOB: 07/10/1975 Today's Date: 04/04/2018    History of Present Illness 42 yo female admitted with onset of sensory numbness to feet and hands. pt reports preceded by 2 weeks of illness. work up at this time to rule out GBS with pending IVIG treatments. PMH:    PT Comments    Patient seen for activity progression. Mobilizing well. Ambulated with RW in halls at supervision level. Current POC remains appropriate.   Follow Up Recommendations  Outpatient PT;Supervision for mobility/OOB     Equipment Recommendations  Rolling walker with 5" wheels    Recommendations for Other Services       Precautions / Restrictions Precautions Precautions: Fall Precaution Comments: reports falls at home    Mobility  Bed Mobility Overal bed mobility: Needs Assistance Bed Mobility: Supine to Sit     Supine to sit: Supervision     General bed mobility comments: increased time and effort, no physical assist required  Transfers Overall transfer level: Needs assistance   Transfers: Sit to/from Stand Sit to Stand: Supervision         General transfer comment: supervision for safety  Ambulation/Gait Ambulation/Gait assistance: Supervision Gait Distance (Feet): 180 Feet Assistive device: Rolling walker (2 wheeled) Gait Pattern/deviations: Step-through pattern;Decreased stride length;Wide base of support;Drifts right/left Gait velocity: decreased   General Gait Details: VCs for increased cadence and upright posture, tolerated well   Stairs             Wheelchair Mobility    Modified Rankin (Stroke Patients Only)       Balance Overall balance assessment: Mild deficits observed, not formally tested                                          Cognition Arousal/Alertness: Awake/alert Behavior During Therapy: WFL for tasks assessed/performed Overall Cognitive Status: Within  Functional Limits for tasks assessed                                        Exercises      General Comments General comments (skin integrity, edema, etc.): VSS throughout session      Pertinent Vitals/Pain Pain Assessment: 0-10 Pain Score: 6  Pain Location: hands and feet Pain Descriptors / Indicators: Constant;Burning;Throbbing Pain Intervention(s): Monitored during session    Home Living                      Prior Function            PT Goals (current goals can now be found in the care plan section) Acute Rehab PT Goals Patient Stated Goal: to get back to doing PT Goal Formulation: With patient Time For Goal Achievement: 04/17/18 Potential to Achieve Goals: Good Progress towards PT goals: Progressing toward goals    Frequency    Min 3X/week      PT Plan Current plan remains appropriate    Co-evaluation              AM-PAC PT "6 Clicks" Mobility   Outcome Measure  Help needed turning from your back to your side while in a flat bed without using bedrails?: None Help needed moving from lying on your back to sitting on the side of a flat  bed without using bedrails?: None Help needed moving to and from a bed to a chair (including a wheelchair)?: A Little Help needed standing up from a chair using your arms (e.g., wheelchair or bedside chair)?: A Little Help needed to walk in hospital room?: A Little Help needed climbing 3-5 steps with a railing? : A Little 6 Click Score: 20    End of Session Equipment Utilized During Treatment: Gait belt Activity Tolerance: Patient tolerated treatment well Patient left: in bed;with call bell/phone within reach Nurse Communication: Mobility status PT Visit Diagnosis: Unsteadiness on feet (R26.81);Difficulty in walking, not elsewhere classified (R26.2);Other symptoms and signs involving the nervous system (R29.898)     Time: [id number] PT Time Calculation (min) (ACUTE ONLY): 17 min  Charges:   $Gait Training: 8-22 mins                     Charlotte Crumbevon Kate Larock, PT DPT  Board Certified Neurologic Specialist Acute Rehabilitation Services Pager [phone] Office [phone]    Fabio AsaDevon J Jacqulyn Barresi 04/04/2018, 4:02 PM

## 2018-04-04 NOTE — Plan of Care (Signed)
Care plans reviewed and patient is progressing.  

## 2018-04-04 NOTE — Progress Notes (Signed)
[facility] TEAM 1 - Stepdown/ICU TEAM  Burnett Harryoye Diantonio  ZOX:[id number] DOB: 09/10/1975 DOA: 04/02/2018 PCP: Jac Canavanysinger, David S, PA-C    Brief Narrative:  42 y.o. female with a hx of herniated disc and intermittent back pain/sciatica who presented with subacute leg weakness, paresthesias in her hands bilaterally, numbness in her feet, and numbness up to her lower chest for 4 weeks. She had a viral URI just prior to Thanksgiving (sore throat, cough, some rhinorrhea). About 1-2 weeks after the URI she began noticing leg numbness and "pins and needles" sensation in her feet and legs. Leading up to admission she began developing episodes of acute tenderness and similar pins and needles sensation over her hands. Her legs became weak to the point that she was having difficulty walking and had a few mechanical falls due to tripping.   Significant Events: 12/30 admit   Subjective: The patient reports ongoing burning tingling sensations in her feet and hands.  She denies current shortness of breath.  She reports that she still feels weak in general but did rest well and feels somewhat refreshed.  She denies headache nausea vomiting or abdominal pain.  Assessment & Plan:  Progressive ascending weakness, sensory deficits, and parasthesia - ?GBS IVIG per Neurology   Macrocytic Anemia - Folic acid deficiency  Hgb 81.110.3 on admission - replacing folic acid via IV   Hypomagnesemia  Mg corrected to 2.0  DVT prophylaxis: lovenox  Code Status: FULL CODE Family Communication:  Disposition Plan: SDU  Consultants:  Neurology   Antimicrobials:  None    Objective: Blood pressure (!) 144/96, pulse 93, temperature 98.5 F (36.9 C), resp. rate 17, height 5\' 3"  (1.6 m), weight 95.6 kg, SpO2 100 %.  Intake/Output Summary (Last 24 hours) at 04/04/2018 1354 Last data filed at 04/04/2018 1000 Gross per 24 hour  Intake 784.76 ml  Output 250 ml  Net 534.76 ml   Filed Weights   04/02/18 2255 04/03/18 0557    Weight: 92.1 kg 95.6 kg    Examination: General: No acute respiratory distress Lungs: Clear to auscultation bilaterally without wheezes or crackles Cardiovascular: Regular rate and rhythm without murmur gallop or rub normal S1 and S2 Abdomen: Nontender, nondistended, soft, bowel sounds positive, no rebound, no ascites, no appreciable mass Extremities: No significant cyanosis, clubbing, or edema bilateral lower extremities   CBC: Recent Labs  Lab 04/02/18 1807 04/02/18 2320 04/03/18 0407 04/04/18 0618  WBC 8.0 8.2 7.7 6.7  HGB 10.3* 9.9* 8.9* 9.1*  HCT 31.3* 30.8* 28.1* 27.8*  MCV 110.2* 109.6* 110.6* 109.9*  PLT 391 355 318 317   Basic Metabolic Panel: Recent Labs  Lab 04/02/18 1807 04/02/18 2320 04/03/18 0407 04/04/18 0618  NA 138  --  138 139  K 4.7  --  3.7 4.0  CL 108  --  106 107  CO2 24  --  24 24  GLUCOSE 87  --  99 91  BUN 7  --  8 5*  CREATININE 0.56 0.53 0.66 0.68  CALCIUM 9.1  --  8.6* 8.4*  MG  --   --  1.7 2.0   GFR: Estimated Creatinine Clearance: 100.8 mL/min (by C-G formula based on SCr of 0.68 mg/dL).  Liver Function Tests: Recent Labs  Lab 04/04/18 0618  AST 20  ALT 13  ALKPHOS 73  BILITOT 0.6  PROT 7.1  ALBUMIN 2.2*    CBG: Recent Labs  Lab 04/02/18 1725  GLUCAP 79     Scheduled Meds: . enoxaparin (LOVENOX)  injection  40 mg Subcutaneous Q24H  . folic acid  1 mg Intravenous Daily  . gabapentin  400 mg Oral TID  . Immune Globulin 10%  400 mg/kg Intravenous Q24 Hr x 5     LOS: 2 days    Lonia BloodJeffrey T. Celsey Asselin, MD [facility] Office  [phone] Pager - Text Page per Amion as per below:  On-Call/Text Page:      [email or web address]  If 7PM-7AM, please contact night-coverage [email or web address] 04/04/2018, 1:54 PM

## 2018-04-05 ENCOUNTER — Encounter (HOSPITAL_COMMUNITY): Payer: Self-pay | Admitting: Family Medicine

## 2018-04-05 DIAGNOSIS — D539 Nutritional anemia, unspecified: Secondary | ICD-10-CM

## 2018-04-05 DIAGNOSIS — E538 Deficiency of other specified B group vitamins: Secondary | ICD-10-CM

## 2018-04-05 HISTORY — DX: Nutritional anemia, unspecified: D53.9

## 2018-04-05 HISTORY — DX: Deficiency of other specified B group vitamins: E53.8

## 2018-04-05 LAB — BASIC METABOLIC PANEL
Anion gap: 7 (ref 5–15)
BUN: 11 mg/dL (ref 6–20)
CALCIUM: 8.7 mg/dL — AB (ref 8.9–10.3)
CO2: 25 mmol/L (ref 22–32)
Chloride: 103 mmol/L (ref 98–111)
Creatinine, Ser: 0.66 mg/dL (ref 0.44–1.00)
GFR calc Af Amer: >60 mL/min (ref >60)
GFR calc non Af Amer: >60 mL/min (ref >60)
Glucose, Bld: 102 mg/dL — ABNORMAL HIGH (ref 70–99)
Potassium: 3.8 mmol/L (ref 3.5–5.1)
Sodium: 135 mmol/L (ref 135–145)

## 2018-04-05 LAB — CBC
HCT: 26.1 % — ABNORMAL LOW (ref 36.0–46.0)
Hemoglobin: 8.6 g/dL — ABNORMAL LOW (ref 12.0–15.0)
MCH: 36.6 pg — AB (ref 26.0–34.0)
MCHC: 33 g/dL (ref 30.0–36.0)
MCV: 111.1 fL — ABNORMAL HIGH (ref 80.0–100.0)
PLATELETS: 265 10*3/uL (ref 150–400)
RBC: 2.35 MIL/uL — AB (ref 3.87–5.11)
RDW: 15.3 % (ref 11.5–15.5)
WBC: 6.4 10*3/uL (ref 4.0–10.5)
nRBC: 0 % (ref 0.0–0.2)

## 2018-04-05 LAB — MAGNESIUM: Magnesium: 1.8 mg/dL (ref 1.7–2.4)

## 2018-04-05 NOTE — Progress Notes (Signed)
NIF -40 VC 1.8 

## 2018-04-05 NOTE — Progress Notes (Signed)
NIF -40 VC 1.7

## 2018-04-05 NOTE — Progress Notes (Signed)
Neurology Progress Note   S:// Seen and examined. Reports some increased tingling sensatiion in legs. Continued unchanged numbness in fingers with mild improvement in left 2 fingers (ring finger and little finger)   O:// Current vital signs: BP (!) 143/96 (BP Location: Left Arm)   Pulse (!) 101   Temp 99.4 F (37.4 C) (Oral)   Resp 19   Ht 5\' 3"  (1.6 m)   Wt 95.6 kg   SpO2 95%   BMI 37.33 kg/m  Vital signs in last 24 hours: Temp:  [98.3 F (36.8 C)-99.4 F (37.4 C)] 99.4 F (37.4 C) (01/01 0619) Pulse Rate:  [92-101] 101 (01/01 0619) Resp:  [17-27] 19 (01/01 0619) BP: (143-151)/(83-97) 143/96 (01/01 0619) SpO2:  [95 %-100 %] 95 % (01/01 0619) GENERAL: Awake, alert in NAD HEENT: - Normocephalic and atraumatic, dry mm, no LN++, no Thyromegally LUNGS - Clear to auscultation bilaterally with no wheezes CV - S1S2 RRR, no m/r/g, equal pulses bilaterally. ABDOMEN - Soft, nontender, nondistended with normoactive BS Ext: warm, well perfused, intact peripheral pulses, no edema NEURO:  Mental Status: AA&Ox3  Language: speech is non-dysarthric.  Naming, repetition, fluency, and comprehension intact. Cranial Nerves: PERRL. EOMI, visual fields full, no facial asymmetry, facial sensation intact, hearing intact, tongue/uvula/soft palate midline, normal sternocleidomastoid and trapezius muscle strength. No evidence of tongue atrophy or fibrillations Motor:  symmetric 5/5 all over including plantar and dorsiflexion but may be some mild weakness in the distal muscles Tone: is normal and bulk is normal Sensation-decreased in a stocking pattern more on the left compared to the right Coordination: FTN intact bilaterally Gait-deferred DTRs: 2+ in bilateral upper extremities.  Absent knee jerks bilaterally.  Absent ankle jerk on the right.  1+ ankle jerk on the left.  Medications  Current Facility-Administered Medications:  .  acetaminophen (TYLENOL) tablet 650 mg, 650 mg, Oral, Q6H PRN,  Jetty DuhamelMcClung, Jeffrey T, MD .  enoxaparin (LOVENOX) injection 40 mg, 40 mg, Subcutaneous, Q24H, Park, Bartholome Billarolyn J, MD, 40 mg at 04/04/18 1357 .  folic acid injection 1 mg, 1 mg, Intravenous, Daily, Lonia BloodMcClung, Jeffrey T, MD, 1 mg at 04/04/18 1053 .  gabapentin (NEURONTIN) capsule 400 mg, 400 mg, Oral, TID, Rejeana BrockKirkpatrick, McNeill P, MD, 400 mg at 04/04/18 2209 .  Immune Globulin 10% (PRIVIGEN) IV infusion 35 g, 400 mg/kg, Intravenous, Q24 Hr x 5, Park, Bartholome Billarolyn J, MD, 35 g at 04/04/18 2229 .  oxyCODONE (Oxy IR/ROXICODONE) immediate release tablet 5-10 mg, 5-10 mg, Oral, Q4H PRN, Lonia BloodMcClung, Jeffrey T, MD, 10 mg at 04/05/18 [id number] .  traMADol (ULTRAM) tablet 50 mg, 50 mg, Oral, Q6H PRN, Jetty DuhamelMcClung, Jeffrey T, MD, 50 mg at 04/03/18 1428   Labs CBC    Component Value Date/Time   WBC 6.4 04/05/2018 0311   RBC 2.35 (L) 04/05/2018 0311   HGB 8.6 (L) 04/05/2018 0311   HCT 26.1 (L) 04/05/2018 0311   PLT 265 04/05/2018 0311   MCV 111.1 (H) 04/05/2018 0311   MCH 36.6 (H) 04/05/2018 0311   MCHC 33.0 04/05/2018 0311   RDW 15.3 04/05/2018 0311   CMP     Component Value Date/Time   NA 135 04/05/2018 0311   K 3.8 04/05/2018 0311   CL 103 04/05/2018 0311   CO2 25 04/05/2018 0311   GLUCOSE 102 (H) 04/05/2018 0311   BUN 11 04/05/2018 0311   CREATININE 0.66 04/05/2018 0311   CREATININE 1.07 10/29/2013 0946   CALCIUM 8.7 (L) 04/05/2018 0311   PROT 7.1 04/04/2018 0618  ALBUMIN 2.2 (L) 04/04/2018 0618   AST 20 04/04/2018 0618   ALT 13 04/04/2018 0618   ALKPHOS 73 04/04/2018 0618   BILITOT 0.6 04/04/2018 0618   GFRNONAA >60 04/05/2018 0311   GFRAA >60 04/05/2018 0311   Imaging I have reviewed images in epic and the results pertinent to this consultation are: MRI examination of the brain and cervical spine with no acute abnormality.  Small C4-5 disc extrusion with mild right-sided spinal stenosis.  Assessment:  43 year old woman with progressive distal numbness weakness and areflexia-clinically consistent with  Guillain-Barr syndrome.  Lumbar puncture was recommended however patient refused.  She has been on empiric IVIG-received 3 doses total.  Impression: Possible Guillain-Barr syndrome  Recommendations: Continue IVIG for total of 5 doses Outpatient neurology appointment in 4 to 6 weeks for neuromuscular evaluation with Dr. Nita Sickleonika Patel as well as EMG nerve conduction study Continue physical therapy and Occupational Therapy. Neurology will be available as needed.   -- Milon DikesAshish Wyvonne Carda, MD [facility] Pager: [phone] If 7pm to 7am, please call on call as listed on AMION.

## 2018-04-05 NOTE — Progress Notes (Signed)
  PROGRESS NOTE  Elijah Hirtz DPO:[id number] DOB: 1976/03/01 DOA: 04/02/2018 PCP: Jac Canavan, PA-C  Brief History   43 year old woman presenting with 3-week history of neurologic symptoms, numbness hands and feet, bilateral lower extremity weakness.  Admitted for progressive distal sensory symptoms and bilateral lower extremity weakness and ascending pattern concerning for GBS.  She was seen by neurology and started on IVIG  A & P  Distal symmetric polyneuropathy, progressive ascending weakness consistent with GBS.  Patient refused lumbar puncture.  MRI brain and cervical spine unrevealing. --Management per neurology, IVIG for total 5 days, vigorous hydration, Neurontin 100 mg 3 times daily --Follow-up with neurology 4-6 weeks for neuromuscular evaluation and EMG with Dr. Nita Sickle  Macrocytic anemia secondary to folate deficiency --Folic acid   Making some improvement.  Neurology has signed off.  Anticipate discharge after completion of IVIG.  DVT prophylaxis: enoxaparin Code Status: Full Family Communication: none Disposition Plan: Home with outpatient PT   Brendia Sacks, MD  [facility] Direct contact: [phone] --Via amion app OR  --[email or web address]; password [email or web address]  7PM-7AM contact night coverage as above 04/05/2018, 10:12 AM  LOS: 3 days   Consultants  . Neurology  Procedures  .   Antibiotics  .   Interval History/Subjective  Feels somewhat better today, was able to walk.  Still has numbness in hands and feet but she does have sensation in pinky finger both sides.  Objective    Vitals:  Vitals:   04/04/18 2345 04/05/18 0619  BP: (!) 145/93 (!) 143/96  Pulse: 93 (!) 101  Resp: 17 19  Temp: 98.4 F (36.9 C) 99.4 F (37.4 C)  SpO2: 95% 95%    Exam:  Constitutional:  . Appears calm and comfortable Eyes:  . pupils and irises appear normal ENMT:  . grossly normal hearing  Respiratory:  . CTA bilaterally, no w/r/r.  . Respiratory  effort normal.  Cardiovascular:  . RRR, no m/r/g . No LE extremity edema   Musculoskeletal:  . Weak grip strength bilaterally Psychiatric:  . Mental status o Mood, affect appropriate  I have personally reviewed the following:   Data: . Basic metabolic panel unremarkable.  Hemoglobin stable 8.6.  Folate was low.  TSH within normal limits.  Serum pregnancy negative. . Chest x-ray and EKG nonacute  Scheduled Meds: . enoxaparin (LOVENOX) injection  40 mg Subcutaneous Q24H  . folic acid  1 mg Intravenous Daily  . gabapentin  400 mg Oral TID  . Immune Globulin 10%  400 mg/kg Intravenous Q24 Hr x 5   Continuous Infusions:  Principal Problem:   Guillain Barr syndrome (HCC) Active Problems:   Folate deficiency   Macrocytic anemia   LOS: 3 days

## 2018-04-06 MED ORDER — CETAPHIL MOISTURIZING EX LOTN
TOPICAL_LOTION | Freq: Two times a day (BID) | CUTANEOUS | Status: DC
  Administered 2018-04-06 – 2018-04-07 (×2): via TOPICAL
  Filled 2018-04-06: qty 473

## 2018-04-06 MED ORDER — FOLIC ACID 1 MG PO TABS
1.0000 mg | ORAL_TABLET | Freq: Every day | ORAL | Status: DC
  Administered 2018-04-07: 1 mg via ORAL
  Filled 2018-04-06: qty 1

## 2018-04-06 NOTE — Progress Notes (Signed)
NIF -40 VC 3.2

## 2018-04-06 NOTE — Progress Notes (Addendum)
  PROGRESS NOTE  Rebecca Goodman NZV:[id number] DOB: 1975-05-10 DOA: 04/02/2018 PCP: Jac Canavan, PA-C  Brief History   43 year old woman presenting with 3-week history of neurologic symptoms, numbness hands and feet, bilateral lower extremity weakness.  Admitted for progressive distal sensory symptoms and bilateral lower extremity weakness and ascending pattern concerning for GBS.  She was seen by neurology and started on IVIG  A & P  Distal symmetric polyneuropathy, progressive ascending weakness consistent with GBS.  Patient refused lumbar puncture.  MRI brain and cervical spine unrevealing. --Continue management per neurology, IVIG for total 5 days (finishes 1/3), Neurontin  --Follow-up with neurology 4-6 weeks for neuromuscular evaluation and EMG with Dr. Nita Sickle  Macrocytic anemia secondary to folate deficiency --Continue folic acid   About the same today, continue IVIG, anticipate discharge after course completed, likely 1/4 in the morning.  No need for telemetry per neurology.  Downgrade to MedSurg level of care.  DVT prophylaxis: enoxaparin Code Status: Full Family Communication: none Disposition Plan: Home with outpatient PT   Brendia Sacks, MD  [facility] Direct contact: [phone] --Via amion app OR  --[email or web address]; password [email or web address]  7PM-7AM contact night coverage as above 04/06/2018, 10:07 AM  LOS: 4 days   Consultants  . Neurology  Procedures  .   Antibiotics  .   Interval History/Subjective  Feels about the same today as far as her hands go with some return of feeling in the left pinky.  Strength appears to be good in the arms and legs.  Feet are more painful today with a burning type sensation.  Objective    Vitals:  Vitals:   04/06/18 0418 04/06/18 0822  BP: (!) 141/94 (!) 145/100  Pulse: (!) 59 100  Resp: 15 (!) 22  Temp: 99.1 F (37.3 C) 98.4 F (36.9 C)  SpO2: 100% 100%    Exam: Constitutional:   . Appears calm and  comfortable Eyes:  . pupils and irises appear normal ENMT:  . grossly normal hearing  Respiratory:  . CTA bilaterally, no w/r/r.  . Respiratory effort normal. Cardiovascular:  . RRR, no m/r/g . No LE extremity edema   Musculoskeletal:  . RUE, LUE, RLE, LLE   . No apparent change Skin:  . Skin bilateral soles of feet very dry Neurologic:  . No apparent change Psychiatric:  . Mental status o Mood, affect appropriate  I have personally reviewed the following:   Data: . No new data  Scheduled Meds: . cetaphil   Topical BID  . enoxaparin (LOVENOX) injection  40 mg Subcutaneous Q24H  . folic acid  1 mg Intravenous Daily  . gabapentin  400 mg Oral TID  . Immune Globulin 10%  400 mg/kg Intravenous Q24 Hr x 5   Continuous Infusions:  Principal Problem:   Guillain Barr syndrome (HCC) Active Problems:   Folate deficiency   Macrocytic anemia   LOS: 4 days

## 2018-04-06 NOTE — Progress Notes (Signed)
Occupational Therapy Treatment Patient Details Name: Rebecca Goodman MRN: [id number] DOB: 05-Jan-1976 Today's Date: 04/06/2018    History of present illness 43 yo female admitted with onset of sensory numbness to feet and hands. pt reports preceded by 2 weeks of illness. work up at this time to rule out GBS with pending IVIG treatments. PMH:   OT comments  Pt continues to report varying degrees of numbness and burning in hands and feet. She demonstrated and reports routinely performing toileting and grooming standing at sink. Educated and performed fine motor coordination activities with common objects and putty. Educated pt in safety and compensatory strategies related to sensation changes.   Follow Up Recommendations  Outpatient OT    Equipment Recommendations  None recommended by OT    Recommendations for Other Services      Precautions / Restrictions Precautions Precautions: Fall Precaution Comments: reports falls at home, has been routinely taking herself to the bathroom without incident using RW       Mobility Bed Mobility               General bed mobility comments: pt seated at EOB  Transfers Overall transfer level: Modified independent Equipment used: Rolling walker (2 wheeled)             General transfer comment: from bed and toilet    Balance                                           ADL either performed or assessed with clinical judgement   ADL       Grooming: Wash/dry hands;Standing;Modified independent               Lower Body Dressing: Set up;Sitting/lateral leans(socks)   Toilet Transfer: Modified Independent;RW;Ambulation                   Vision       Perception     Praxis      Cognition Arousal/Alertness: Awake/alert Behavior During Therapy: WFL for tasks assessed/performed Overall Cognitive Status: Within Functional Limits for tasks assessed                                           Exercises Exercises: Other exercises Other Exercises Other Exercises: instructed and performed med soft theraputty activities, fine motor activities using nuts and bolts, pegs, coins, and handwriting.   Shoulder Instructions       General Comments      Pertinent Vitals/ Pain       Pain Assessment: Faces Faces Pain Scale: Hurts little more Pain Location: fingers with resistive activities Pain Descriptors / Indicators: Sore Pain Intervention(s): Monitored during session;Repositioned  Home Living                                          Prior Functioning/Environment              Frequency  Min 2X/week        Progress Toward Goals  OT Goals(current goals can now be found in the care plan section)  Progress towards OT goals: Progressing toward goals  Acute Rehab OT Goals Patient Stated Goal: return to independence  OT Goal Formulation: With patient Time For Goal Achievement: 04/17/18 Potential to Achieve Goals: Good  Plan Discharge plan remains appropriate    Co-evaluation                 AM-PAC OT "6 Clicks" Daily Activity     Outcome Measure   Help from another person eating meals?: None Help from another person taking care of personal grooming?: None Help from another person toileting, which includes using toliet, bedpan, or urinal?: None Help from another person bathing (including washing, rinsing, drying)?: A Little Help from another person to put on and taking off regular upper body clothing?: None Help from another person to put on and taking off regular lower body clothing?: A Little 6 Click Score: 22    End of Session Equipment Utilized During Treatment: Rolling walker  OT Visit Diagnosis: Unsteadiness on feet (R26.81);Repeated falls (R29.6)   Activity Tolerance Patient tolerated treatment well   Patient Left in bed;with call bell/phone within reach;with family/visitor present   Nurse Communication           Time: 1694-5038 OT Time Calculation (min): 33 min  Charges: OT General Charges $OT Visit: 1 Visit OT Treatments $Self Care/Home Management : 8-22 mins $Therapeutic Activity: 8-22 mins  Martie Round, OTR/L Acute Rehabilitation Services Pager: [phone] Office: [phone]   Rebecca Goodman 04/06/2018, 3:55 PM

## 2018-04-06 NOTE — Progress Notes (Signed)
Physical Therapy Treatment Patient Details Name: Rebecca Goodman MRN: [id number] DOB: 1975/10/25 Today's Date: 04/06/2018    History of Present Illness 43 yo female admitted with onset of sensory numbness to feet and hands. pt reports preceded by 2 weeks of illness. work up at this time to rule out GBS with pending IVIG treatments. PMH:    PT Comments    Pt progressing with mobility.    Follow Up Recommendations  Outpatient PT;Supervision - Intermittent     Equipment Recommendations  Rolling walker with 5" wheels    Recommendations for Other Services       Precautions / Restrictions Precautions Precautions: Fall Precaution Comments: reports falls at home, has been routinely taking herself to the bathroom without incident using RW    Mobility  Bed Mobility Overal bed mobility: Modified Independent             General bed mobility comments: pt seated at EOB  Transfers Overall transfer level: Modified independent Equipment used: Rolling walker (2 wheeled) Transfers: Sit to/from Stand Sit to Stand: Modified independent (Device/Increase time)         General transfer comment: from bed and toilet  Ambulation/Gait Ambulation/Gait assistance: Supervision Gait Distance (Feet): 325 Feet Assistive device: Rolling walker (2 wheeled) Gait Pattern/deviations: Step-through pattern;Decreased stride length Gait velocity: decr Gait velocity interpretation: 1.31 - 2.62 ft/sec, indicative of limited community ambulator General Gait Details: No physical assist needed   Stairs Stairs: Yes Stairs assistance: Min guard Stair Management: Two rails;Step to pattern;Forwards(used walker to simulate rails) Number of Stairs: 9 General stair comments: Performed repeated stepping up/down 1 portable step. Did this x 9   Wheelchair Mobility    Modified Rankin (Stroke Patients Only)       Balance Overall balance assessment: Mild deficits observed, not formally tested                                          Cognition Arousal/Alertness: Awake/alert Behavior During Therapy: WFL for tasks assessed/performed Overall Cognitive Status: Within Functional Limits for tasks assessed                                        Exercises Other Exercises Other Exercises: instructed and performed med soft theraputty activities, fine motor activities using nuts and bolts, pegs, coins, and handwriting.    General Comments        Pertinent Vitals/Pain Pain Assessment: Faces Faces Pain Scale: Hurts little more Pain Location: fingers with resistive activities Pain Descriptors / Indicators: Sore Pain Intervention(s): Monitored during session;Repositioned    Home Living                      Prior Function            PT Goals (current goals can now be found in the care plan section) Acute Rehab PT Goals Patient Stated Goal: return to independence Progress towards PT goals: Progressing toward goals    Frequency    Min 3X/week      PT Plan Current plan remains appropriate    Co-evaluation              AM-PAC PT "6 Clicks" Mobility   Outcome Measure  Help needed turning from your back to your side while in a flat bed  without using bedrails?: None Help needed moving from lying on your back to sitting on the side of a flat bed without using bedrails?: None Help needed moving to and from a bed to a chair (including a wheelchair)?: None Help needed standing up from a chair using your arms (e.g., wheelchair or bedside chair)?: None Help needed to walk in hospital room?: None Help needed climbing 3-5 steps with a railing? : A Little 6 Click Score: 23    End of Session Equipment Utilized During Treatment: Gait belt Activity Tolerance: Patient tolerated treatment well Patient left: in bed;with family/visitor present;Other (comment)(sitting EOB with OT in the room) Nurse Communication: Mobility status PT Visit  Diagnosis: Other symptoms and signs involving the nervous system (R29.898);Difficulty in walking, not elsewhere classified (R26.2)     Time: 7353-2992 PT Time Calculation (min) (ACUTE ONLY): 18 min  Charges:  $Gait Training: 8-22 mins                     [facility] PT Acute Rehabilitation Services Pager [phone] Office [phone]    Angelina Ok [facility] 04/06/2018, 4:39 PM

## 2018-04-06 NOTE — Progress Notes (Signed)
NIF-40 VC 2.8

## 2018-04-07 ENCOUNTER — Encounter: Payer: Self-pay | Admitting: Neurology

## 2018-04-07 MED ORDER — OXYCODONE HCL 5 MG PO TABS: 5.0000 mg | ORAL_TABLET | Freq: Four times a day (QID) | ORAL | 0 refills | Status: AC | PRN

## 2018-04-07 MED ORDER — FOLIC ACID 1 MG PO TABS: 1.0000 mg | ORAL_TABLET | Freq: Every day | ORAL | 1 refills | Status: AC

## 2018-04-07 MED ORDER — GABAPENTIN 400 MG PO CAPS: 400.0000 mg | ORAL_CAPSULE | Freq: Three times a day (TID) | ORAL | 1 refills | Status: DC

## 2018-04-07 MED ORDER — IBUPROFEN 200 MG PO TABS: 400.0000 mg | ORAL_TABLET | Freq: Four times a day (QID) | ORAL | 0 refills | Status: DC | PRN

## 2018-04-07 NOTE — Care Management Note (Addendum)
Case Management Note  Patient Details  Name: Rebecca Goodman MRN: [id number] Date of Birth: 1975-05-28  Subjective/Objective:     Pt admitted with sensory numbness                Action/Plan:   PTA independent from home with family.  Pt has PCP currently however recently lost her job and is also interested in information for local clinics - CM provided info on AVS.  Pt has been paying for prescriptions out of pocket PTA., CM informed Pt about Good[facility].  Pt informed CM that she can afford discharge medications out of pocket.   CM offered DME choice - pt chose [facility] - agency accepted referral.  Pt chose [facility] location for outpt therapy - CM sent referral via epic   Expected Discharge Date:  04/07/18               Expected Discharge Plan:  Home/Self Care  In-House Referral:     Discharge planning Services  CM Consult  Post Acute Care Choice:    Choice offered to:  Patient(Outpt PT and OT referral sent via epic for [facility])  DME Arranged:  Dan Humphreys rolling DME Agency:  [facility]  HH Arranged:    [facility] Agency:     Status of Service:  Completed, signed off  If discussed at Long Length of Stay Meetings, dates discussed:    Additional Comments:  Cherylann Parr, RN 04/07/2018, 11:55 AM

## 2018-04-07 NOTE — Discharge Summary (Addendum)
Physician Discharge Summary  Rebecca Goodman WUJ:[id number] DOB: [birth year] DOA: 04/02/2018  PCP: Jac Canavan, PA-C  Admit date: 04/02/2018 Discharge date: 04/07/2018  Recommendations for Outpatient Follow-up:   Distal symmetric polyneuropathy, progressive ascending weakness consistent with GBS.    Macrocytic anemia secondary to folate deficiency --Continue folic acid  Follow-up Information    Jac Canavan, PA-C. Schedule an appointment as soon as possible for a visit in 2 week(s).   Specialty:  Family Medicine Contact information: [address] [place] [address] [phone]        [place] Chard, DO. Schedule an appointment as soon as possible for a visit in 4 week(s).   Specialty:  Neurology Contact information: [address] [place] [address] [phone]        [facility] - Dme Follow up.   Why:  Dispensing optician information: [address] [place] [address] [phone]        [facility]. Call.   Why:  for appt Contact information: [address] [place] [address] [phone]       [facility]. Call.   Why:  call for primary appt  Contact information: [address], [address] [place] [address]           Discharge Diagnoses: Principal diagnosis is #1 1. Distal symmetric polyneuropathy, progressive ascending weakness consistent with GBS 2. Macrocytic anemia secondary to folate deficiency  Discharge Condition: improved Disposition: home   Diet recommendation: regular  Filed Weights   04/02/18 2255 04/03/18 0557  Weight: 92.1 kg 95.6 kg    History of present illness:  43 year old woman presenting with 3-week history of neurologic symptoms, numbness hands and feet, bilateral lower extremity weakness.  Hospital Course:  Admitted for progressive distal sensory symptoms and  bilateral lower extremity weakness and ascending pattern concerning for GBS.  She was seen by neurology and started on IVIG.  Gait and improved although she still has significant pain in her hands.  Distal symmetric polyneuropathy, progressive ascending weakness consistent with GBS.  Patient refused lumbar puncture.  MRI brain and cervical spine unrevealing. --IVIG finished today, total 5 days.  Continue Neurontin.   --Follow-up with neurology 4-6 weeks for neuromuscular evaluation and EMG with Dr. Nita Sickle --Short course of analgesics for hand pain.  Macrocytic anemia secondary to folate deficiency --Continue folic acid  Today's assessment: S: Substantially better today in regard to ambulation.  However continues have severe burning pain in her hands. O: Vitals:  Vitals:   04/06/18 2044 04/07/18 0602  BP: (!) 149/97 (!) 142/95  Pulse: 97 (!) 106  Resp:  18  Temp: 98.8 F (37.1 C) 98.7 F (37.1 C)  SpO2: 96% 95%    Constitutional:  . Appears calm and comfortable Respiratory:  . CTA bilaterally, no w/r/r.  . Respiratory effort normal. N Cardiovascular:  . RRR, no m/r/g . No LE extremity edema   Musculoskeletal:  . RLE, LLE   . Moves both legs without difficulty Psychiatric:  . Mental status o Mood, affect appropriate   Discharge Instructions  Discharge Instructions    Ambulatory referral to Neurology   Complete by:  As directed    An appointment is requested in approximately: 4 weeks   Ambulatory referral to Occupational Therapy   Complete by:  As directed    Ambulatory referral to Physical Therapy   Complete by:  As directed    Diet  general   Complete by:  As directed    Discharge instructions   Complete by:  As directed    Call your physician or seek immediate medical attention for weakness, numbness, pain, difficulty breathing or worsening of condition.   Increase activity slowly   Complete by:  As directed      Allergies as of 04/07/2018       Reactions   Prednisone Other (See Comments)   IM injection caused severe anxiety, felt like heart attack      Medication List    STOP taking these medications   acetaminophen 650 MG CR tablet Commonly known as:  TYLENOL   cyclobenzaprine 10 MG tablet Commonly known as:  FLEXERIL   HYDROcodone-acetaminophen 7.5-325 MG tablet Commonly known as:  NORCO   naproxen sodium 220 MG tablet Commonly known as:  ALEVE     TAKE these medications   ALOE VERA/LIDOCAINE EX Apply 1 application topically daily as needed (hand pain).   folic acid 1 MG tablet Commonly known as:  FOLVITE Take 1 tablet (1 mg total) by mouth daily. Start taking on:  April 08, 2018   gabapentin 400 MG capsule Commonly known as:  NEURONTIN Take 1 capsule (400 mg total) by mouth 3 (three) times daily.   ibuprofen 200 MG tablet Commonly known as:  ADVIL,MOTRIN Take 2 tablets (400 mg total) by mouth every 6 (six) hours as needed for mild pain (pain). What changed:    how much to take  reasons to take this   oxyCODONE 5 MG immediate release tablet Commonly known as:  Oxy IR/ROXICODONE Take 1-2 tablets (5-10 mg total) by mouth every 6 (six) hours as needed (take 1 tab for moderate pain, take 2 tabs for severe pain).      Allergies  Allergen Reactions  . Prednisone Other (See Comments)    IM injection caused severe anxiety, felt like heart attack    The results of significant diagnostics from this hospitalization (including imaging, microbiology, ancillary and laboratory) are listed below for reference.    Significant Diagnostic Studies: Dg Chest 2 View  Result Date: 04/02/2018 CLINICAL DATA:  Chest pain. EXAM: CHEST - 2 VIEW COMPARISON:  Radiographs of June 25, 2013. FINDINGS: The heart size and mediastinal contours are within normal limits. Both lungs are clear. No pneumothorax or pleural effusion is noted. The visualized skeletal structures are unremarkable. IMPRESSION: No active cardiopulmonary  disease. Electronically Signed   By: Lupita Raider, M.D.   On: 04/02/2018 19:37   Ct Head Wo Contrast  Result Date: 04/02/2018 CLINICAL DATA:  Inflammatory polyneuropathy with numbness in the extremities and blurriness of the IIS. EXAM: CT HEAD WITHOUT CONTRAST TECHNIQUE: Contiguous axial images were obtained from the base of the skull through the vertex without intravenous contrast. COMPARISON:  None. FINDINGS: BRAIN: The ventricles and sulci are normal. No intraparenchymal hemorrhage, mass effect nor midline shift. No acute large vascular territory infarcts. Grey-white matter distinction is maintained. The basal ganglia are unremarkable. No abnormal extra-axial fluid collections. Basal cisterns are not effaced and midline. The brainstem and cerebellar hemispheres are without acute abnormalities. VASCULAR: Unremarkable. SKULL/SOFT TISSUES: No skull fracture. No significant soft tissue swelling. ORBITS/SINUSES: The included ocular globes and orbital contents are normal.The mastoid air cells are clear. The included paranasal sinuses are well-aerated. OTHER: None. IMPRESSION: Normal head CT. Electronically Signed   By: Tollie Eth M.D.   On: 04/02/2018 20:17   Mr Laqueta Jean WC Contrast  Result Date: 04/03/2018 CLINICAL DATA:  Progressive upper and lower extremity sensory symptoms and bilateral lower extremity weakness and ascending pattern. Lower extremity areflexia. Brief nystagmus. EXAM: MRI HEAD WITHOUT AND WITH CONTRAST MRI CERVICAL SPINE WITHOUT AND WITH CONTRAST TECHNIQUE: Multiplanar, multiecho pulse sequences of the brain and surrounding structures, and cervical spine, to include the craniocervical junction and cervicothoracic junction, were obtained without and with intravenous contrast. CONTRAST:  9 mL Gadavist COMPARISON:  None. FINDINGS: MRI HEAD FINDINGS Some sequences are mildly to moderately motion degraded. Brain: There is no evidence of acute infarct, intracranial hemorrhage, mass, midline  shift, or extra-axial fluid collection. The ventricles and sulci are normal. The brain is normal in signal. No abnormal enhancement is identified. Vascular: Major intracranial vascular flow voids are preserved. Skull and upper cervical spine: Unremarkable bone marrow signal. Sinuses/Orbits: Unremarkable orbits. Minimal right ethmoid air cell mucosal thickening/secretions. Trace bilateral mastoid fluid. Other: None. MRI CERVICAL SPINE FINDINGS The study is motion degraded throughout with some axial sequences being severely motion degraded. Alignment: Cervical spine straightening.  No listhesis. Vertebrae: Diffusely diminished bone marrow T1 signal intensity which may be related to patient's known anemia. No suspicious focal osseous lesion, fracture, or significant marrow edema. Cord: No abnormal cord signal or abnormal intradural enhancement identified within limitations of motion artifact. Posterior Fossa, vertebral arteries, paraspinal tissues: Grossly preserved vertebral artery flow voids. Mild diffuse thyroid enlargement. Disc levels: Preserved disc space heights. Small right paracentral disc extrusion with slight superior migration at C4-5 resulting in mild right-sided spinal stenosis and mild right-sided cord flattening. No disc herniation or stenosis identified elsewhere with assessment limited by motion. IMPRESSION: 1. Unremarkable appearance of the brain. 2. Motion degraded cervical spine MRI. No definite spinal cord abnormality. 3. Small C4-5 disc extrusion with mild right-sided spinal stenosis. Electronically Signed   By: Sebastian AcheAllen  Grady M.D.   On: 04/03/2018 09:35   Mr Cervical Spine W Wo Contrast  Result Date: 04/03/2018 CLINICAL DATA:  Progressive upper and lower extremity sensory symptoms and bilateral lower extremity weakness and ascending pattern. Lower extremity areflexia. Brief nystagmus. EXAM: MRI HEAD WITHOUT AND WITH CONTRAST MRI CERVICAL SPINE WITHOUT AND WITH CONTRAST TECHNIQUE: Multiplanar,  multiecho pulse sequences of the brain and surrounding structures, and cervical spine, to include the craniocervical junction and cervicothoracic junction, were obtained without and with intravenous contrast. CONTRAST:  9 mL Gadavist COMPARISON:  None. FINDINGS: MRI HEAD FINDINGS Some sequences are mildly to moderately motion degraded. Brain: There is no evidence of acute infarct, intracranial hemorrhage, mass, midline shift, or extra-axial fluid collection. The ventricles and sulci are normal. The brain is normal in signal. No abnormal enhancement is identified. Vascular: Major intracranial vascular flow voids are preserved. Skull and upper cervical spine: Unremarkable bone marrow signal. Sinuses/Orbits: Unremarkable orbits. Minimal right ethmoid air cell mucosal thickening/secretions. Trace bilateral mastoid fluid. Other: None. MRI CERVICAL SPINE FINDINGS The study is motion degraded throughout with some axial sequences being severely motion degraded. Alignment: Cervical spine straightening.  No listhesis. Vertebrae: Diffusely diminished bone marrow T1 signal intensity which may be related to patient's known anemia. No suspicious focal osseous lesion, fracture, or significant marrow edema. Cord: No abnormal cord signal or abnormal intradural enhancement identified within limitations of motion artifact. Posterior Fossa, vertebral arteries, paraspinal tissues: Grossly preserved vertebral artery flow voids. Mild diffuse thyroid enlargement. Disc levels: Preserved disc space heights. Small right paracentral disc extrusion with slight superior migration at C4-5 resulting in mild right-sided spinal stenosis and mild right-sided cord flattening. No disc herniation or stenosis identified elsewhere with assessment  limited by motion. IMPRESSION: 1. Unremarkable appearance of the brain. 2. Motion degraded cervical spine MRI. No definite spinal cord abnormality. 3. Small C4-5 disc extrusion with mild right-sided spinal  stenosis. Electronically Signed   By: Sebastian Ache M.D.   On: 04/03/2018 09:35   Mr Lumbar Spine W Wo Contrast  Result Date: 04/02/2018 CLINICAL DATA:  Extremity numbness.  Difficulty walking. EXAM: MRI LUMBAR SPINE WITHOUT AND WITH CONTRAST TECHNIQUE: Multiplanar and multiecho pulse sequences of the lumbar spine were obtained without and with intravenous contrast. CONTRAST:  10 mL Gadavist COMPARISON:  None. FINDINGS: Segmentation: Normal. The lowest disc space is considered to be L5-S1. Alignment:  Normal Vertebrae: No acute compression fracture, discitis-osteomyelitis of focal marrow lesion. Conus medullaris and cauda equina: The conus medullaris terminates at the L1 level. There is no clumping or enlargement of the cauda equina nerve roots. There is faint enhancement on postcontrast imaging, greater on the left. Paraspinal and other soft tissues: The visualized aorta, IVC and iliac vessels are normal. The visualized retroperitoneal organs and paraspinal soft tissues are normal. Disc levels: Sagittal plane imaging includes the T10-11 disc level through the upper sacrum, with axial imaging of the T11-12 to L5-S1 disc levels. The T10-L4 levels are normal. L4-5: Disc desiccation with small bulge. No spinal canal or neural foraminal stenosis. L5-S1: Normal. The visualized portion of the sacrum is normal. IMPRESSION: 1. Faint contrast enhancement of the cauda equina nerve roots without clumping or enlargement. The findings are equivocal, but compatible with an autoimmune neuropathy such as Guillain-Barre syndrome in the reported clinical context. 2. No spinal canal stenosis or neural impingement. Electronically Signed   By: Deatra Robinson M.D.   On: 04/02/2018 23:40   Labs: Basic Metabolic Panel: Recent Labs  Lab 04/02/18 1807 04/02/18 2320 04/03/18 0407 04/04/18 0618 04/05/18 0311  NA 138  --  138 139 135  K 4.7  --  3.7 4.0 3.8  CL 108  --  106 107 103  CO2 24  --  24 24 25   GLUCOSE 87  --  99 91  102*  BUN 7  --  8 5* 11  CREATININE 0.56 0.53 0.66 0.68 0.66  CALCIUM 9.1  --  8.6* 8.4* 8.7*  MG  --   --  1.7 2.0 1.8   Liver Function Tests: Recent Labs  Lab 04/04/18 0618  AST 20  ALT 13  ALKPHOS 73  BILITOT 0.6  PROT 7.1  ALBUMIN 2.2*   Recent Labs  Lab 04/02/18 1807 04/02/18 2320 04/03/18 0407 04/04/18 0618 04/05/18 0311  WBC 8.0 8.2 7.7 6.7 6.4  HGB 10.3* 9.9* 8.9* 9.1* 8.6*  HCT 31.3* 30.8* 28.1* 27.8* 26.1*  MCV 110.2* 109.6* 110.6* 109.9* 111.1*  PLT 391 355 318 317 265   CBG: Recent Labs  Lab 04/02/18 1725  GLUCAP 79    Principal Problem:   Guillain Barr syndrome (HCC) Active Problems:   Folate deficiency   Macrocytic anemia   Time coordinating discharge: 35 minutes  Signed:  Brendia Sacks, MD [facility] 04/07/2018, 8:10 PM

## 2018-04-07 NOTE — Progress Notes (Signed)
Order received to discharge patient.  PIV access removed.  Discharge instructions, follow up, medications and instructions for their use discussed with patient. 

## 2018-04-07 NOTE — Progress Notes (Signed)
RT came to do NIF and VC. Pt unavailable at this time.

## 2018-04-10 ENCOUNTER — Telehealth: Payer: Self-pay

## 2018-04-10 NOTE — Telephone Encounter (Signed)
Patient was recently seen at the hospital. At discharged she was informed to call and schedule an appt with her pcp. Called patient to schedule the appointment but was unable to reach her by phone. If patient calls back please schedule a hospital follow up with Rebecca Goodman some time next week.

## 2018-04-25 ENCOUNTER — Encounter: Payer: Self-pay | Admitting: Critical Care Medicine

## 2018-04-25 ENCOUNTER — Telehealth: Payer: Self-pay

## 2018-04-25 ENCOUNTER — Ambulatory Visit: Payer: Self-pay | Attending: Critical Care Medicine | Admitting: Critical Care Medicine

## 2018-04-25 VITALS — BP 152/98 | HR 100 | Temp 98.5°F | Ht 63.0 in | Wt 206.0 lb

## 2018-04-25 DIAGNOSIS — M79641 Pain in right hand: Secondary | ICD-10-CM | POA: Insufficient documentation

## 2018-04-25 DIAGNOSIS — G610 Guillain-Barre syndrome: Secondary | ICD-10-CM | POA: Insufficient documentation

## 2018-04-25 DIAGNOSIS — R531 Weakness: Secondary | ICD-10-CM | POA: Insufficient documentation

## 2018-04-25 DIAGNOSIS — E538 Deficiency of other specified B group vitamins: Secondary | ICD-10-CM | POA: Insufficient documentation

## 2018-04-25 DIAGNOSIS — M79671 Pain in right foot: Secondary | ICD-10-CM | POA: Insufficient documentation

## 2018-04-25 DIAGNOSIS — M79642 Pain in left hand: Secondary | ICD-10-CM | POA: Insufficient documentation

## 2018-04-25 DIAGNOSIS — M79672 Pain in left foot: Secondary | ICD-10-CM | POA: Insufficient documentation

## 2018-04-25 DIAGNOSIS — Z79899 Other long term (current) drug therapy: Secondary | ICD-10-CM | POA: Insufficient documentation

## 2018-04-25 DIAGNOSIS — D539 Nutritional anemia, unspecified: Secondary | ICD-10-CM | POA: Insufficient documentation

## 2018-04-25 DIAGNOSIS — F17210 Nicotine dependence, cigarettes, uncomplicated: Secondary | ICD-10-CM | POA: Insufficient documentation

## 2018-04-25 MED ORDER — GABAPENTIN 300 MG PO CAPS: 600.0000 mg | ORAL_CAPSULE | Freq: Three times a day (TID) | ORAL | 0 refills | Status: AC

## 2018-04-25 MED ORDER — IBUPROFEN 800 MG PO TABS: 800.0000 mg | ORAL_TABLET | Freq: Three times a day (TID) | ORAL | 0 refills | Status: AC | PRN

## 2018-04-25 MED FILL — GABAPENTIN 300 MG CAPSULE: 300 | 6 days supply | Qty: 40 | Fill #0

## 2018-04-25 MED FILL — IBUPROFEN 800 MG TABLET: 800 | 10 days supply | Qty: 30 | Fill #0

## 2018-04-25 NOTE — Assessment & Plan Note (Signed)
Guillain  barre syndrome involving lower extremities and upper extremities with significant improvement from admission 04/02/2018  Significant paresthesias and dysesthesias with significant pain in both hands and feet not well controlled with gabapentin at 400 mg 3 times daily  Plan Begin ibuprofen 800 mg every 8 hours as needed for pain Increase gabapentin to 600 mg 3 times daily x5 days then reduce back to 40 mg 3 times daily thereafter  We are hopeful the patient's insurance will go through so that she can obtain the home-based physical therapy and Occupational Therapy  The patient is encouraged to keep her neurology appointment upcoming within a week

## 2018-04-25 NOTE — Telephone Encounter (Signed)
Met with the patient when she was in the clinic today. She said that she applied for medicaid when she was in the hospital in December.  When she called DSS to check on the status of the application, she was informed that they did not have her application. Instructed her to contact the [place] Counselor to inquire about the status of the submission of the application. Provided her with the phone # for [employer] [employer] # [phone]. She was anxious to have her medicaid approved so that she would be able to see her neurologist.   Also informed her that she is eligible for a one time free fill of her medications at [facility].

## 2018-04-25 NOTE — Assessment & Plan Note (Signed)
Macrocytic anemia with subsequent folic acid deficiency  Continue folic acid replacement therapy  Follow-up CBC

## 2018-04-25 NOTE — Patient Instructions (Addendum)
Increase Neurontin to 600 mg 3 times daily, I sent a 300 mg capsule to our pharmacy and you will take 2 of those 3 times daily and I would do this for a period of 5 days and then resume the 400 mg 3 times daily thereafter  Increase ibuprofen to 800 mg 3 times daily as needed again I sent an 800 mg ibuprofen prescription to our pharmacy  Continue the folic acid daily  We will follow-up on your Medicaid status, so that we can begin physical and occupational therapy  Please keep your neurology appointment that is upcoming  We will monitor your blood pressure  Laboratory today will include a complete metabolic panel and complete blood counts  Return visit with Dr. Delford FieldWright in 2 weeks

## 2018-04-25 NOTE — Progress Notes (Signed)
Subjective:    Patient ID: Rebecca Goodman, female    DOB: 09/26/1975, 43 y.o.   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  The discharge summary is below  Distal symmetric polyneuropathy, progressive ascending weakness consistent with GBS.   Macrocytic anemia secondary to folate deficiency --Continue folicacid  History of present illness:  43 year old woman presenting with 3-week history of neurologic symptoms, numbness hands and feet, bilateral lower extremity weakness.  Hospital Course:  Admitted for progressive distal sensory symptoms and bilateral lower extremity weakness and ascending pattern concerning for GBS. She was seen by neurology and started on IVIG.  Gait and improved although she still has significant pain in her hands.  Distal symmetric polyneuropathy, progressive ascending weakness consistent with GBS. Patient refused lumbar puncture. MRI brain and cervical spine unrevealing. --IVIG finished today, total 5 days.  Continue Neurontin.   --Follow-up with neurology4-6 weeks for neuromuscular evaluation and EMG with Dr. Nita Sickleonika Patel --Short course of analgesics for hand pain.  Macrocytic anemia secondary to folate deficiency --Continue folicacid  Since discharge the patient has not yet begun physical therapy due to insurance barriers.  The patient complains of significant paresthesias and dysesthesias in both hands and feet.  The Neurontin at 400 mg 3 times daily is only minimally helping these symptoms.  Her level of weakness has improved however if she walks long distances her legs will lock up however she is not had any falls since being discharged.      Past Medical History:  Diagnosis Date  . Arthralgia of hip   . Arthralgia of knee   . Folate deficiency 04/05/2018  .  Macrocytic anemia 04/05/2018  . Obesity      Family History  Problem Relation Age of Onset  . Hypertension Father   . Stroke Father [id number]  . Diabetes Mother        borderline  . Diabetes Maternal Aunt   . Diabetes Maternal Grandmother   . Diabetes Maternal Aunt   . Sudden death Neg Hx   . Heart attack Neg Hx   . Hyperlipidemia Neg Hx   . Cancer Neg Hx   . Heart disease Neg Hx      Social History   Socioeconomic History  . Marital status: Single    Spouse name: Not on file  . Number of children: Not on file  . Years of education: Not on file  . Highest education level: Not on file  Occupational History  . Not on file  Social Needs  . Financial resource strain: Not on file  . Food insecurity:    Worry: Not on file    Inability: Not on file  . Transportation needs:    Medical: Not on file    Non-medical: Not on file  Tobacco Use  . Smoking status: Current Every Day Smoker    Packs/day: 0.50    Types: Cigarettes  . Smokeless tobacco: Never Used  Substance and Sexual Activity  . Alcohol use: Yes    Alcohol/week: 2.0 standard drinks    Types: 1 Glasses of wine, 1 Shots of liquor per week  . Drug use: No  . Sexual activity: Never    Birth control/protection: None  Lifestyle  . Physical activity:    Days per week: Not on file    Minutes per session: Not on file  .  Stress: Not on file  Relationships  . Social connections:    Talks on phone: Not on file    Gets together: Not on file    Attends religious service: Not on file    Active member of club or organization: Not on file    Attends meetings of clubs or organizations: Not on file    Relationship status: Not on file  . Intimate partner violence:    Fear of current or ex partner: Not on file    Emotionally abused: Not on file    Physically abused: Not on file    Forced sexual activity: Not on file  Other Topics Concern  . Not on file  Social History Narrative   Daughter lives with her.   Works as Psychologist, sport and exercise.   Exercise - walking, stretching, aerobics.       Allergies  Allergen Reactions  . Prednisone Other (See Comments)    IM injection caused severe anxiety, felt like heart attack     Outpatient Medications Prior to Visit  Medication Sig Dispense Refill  . folic acid (FOLVITE) 1 MG tablet Take 1 tablet (1 mg total) by mouth daily. 30 tablet 1  . Lidocaine-Aloe Vera (ALOE VERA/LIDOCAINE EX) Apply 1 application topically daily as needed (hand pain).    Marland Kitchen oxyCODONE (OXY IR/ROXICODONE) 5 MG immediate release tablet Take 1-2 tablets (5-10 mg total) by mouth every 6 (six) hours as needed (take 1 tab for moderate pain, take 2 tabs for severe pain). 40 tablet 0  . gabapentin (NEURONTIN) 400 MG capsule Take 1 capsule (400 mg total) by mouth 3 (three) times daily. 90 capsule 1  . ibuprofen (ADVIL,MOTRIN) 200 MG tablet Take 2 tablets (400 mg total) by mouth every 6 (six) hours as needed for mild pain (pain). 30 tablet 0   No facility-administered medications prior to visit.       Review of Systems  Constitutional: Negative.   HENT: Negative.   Respiratory: Negative.   Cardiovascular: Negative.   Gastrointestinal: Negative.   Genitourinary: Negative.   Musculoskeletal: Positive for gait problem. Negative for myalgias, neck pain and neck stiffness.  Skin: Negative.   Neurological: Positive for weakness, numbness and headaches. Negative for dizziness, tremors, seizures, facial asymmetry, speech difficulty and light-headedness.  Hematological: Negative.   Psychiatric/Behavioral: Negative.        Objective:   Physical Exam  BP (!) 152/98 (BP Location: Left Arm, Patient Position: Sitting, Cuff Size: Large)   Pulse 100   Temp 98.5 F (36.9 C) (Oral)   Ht 5\' 3"  (1.6 m)   Wt 206 lb (93.4 kg)   LMP 04/20/2018 (Exact Date)   SpO2 98%   BMI 36.49 kg/m   Gen: Pleasant, well-nourished, in no distress,  normal affect  ENT: No lesions,  mouth clear,  oropharynx clear, no  postnasal drip  Neck: No JVD, no TMG, no carotid bruits  Lungs: No use of accessory muscles, no dullness to percussion, clear without rales or rhonchi  Cardiovascular: RRR, heart sounds normal, no murmur or gallops, no peripheral edema  Abdomen: soft and NT, no HSM,  BS normal  Musculoskeletal: No deformities, no cyanosis or clubbing  Neuro: alert, upper extremities have good strength however the lower extremities have 4/5 giveaway weakness.  There is decreased sensation in the feet and in the palms of the hands.   Skin: Warm, no lesions or rashes BMP Latest Ref Rng & Units 04/05/2018 04/04/2018 04/03/2018  Glucose 70 - 99  mg/dL 161(W) 91 99  BUN 6 - 20 mg/dL 11 5(L) 8  Creatinine 9.60 - 1.00 mg/dL 4.54 0.98 1.19  Sodium 135 - 145 mmol/L 135 139 138  Potassium 3.5 - 5.1 mmol/L 3.8 4.0 3.7  Chloride 98 - 111 mmol/L 103 107 106  CO2 22 - 32 mmol/L 25 24 24   Calcium 8.9 - 10.3 mg/dL 1.4(N) 8.2(N) 5.6(O)   CBC Latest Ref Rng & Units 04/05/2018 04/04/2018 04/03/2018  WBC 4.0 - 10.5 K/uL 6.4 6.7 7.7  Hemoglobin 12.0 - 15.0 g/dL 1.3(Y) 8.6(V) 8.9(L)  Hematocrit 36.0 - 46.0 % 26.1(L) 27.8(L) 28.1(L)  Platelets 150 - 400 K/uL 265 317 318       Assessment & Plan:  I personally reviewed all images and lab data in the [facility] system as well as any outside material available during this office visit and agree with the  radiology impressions.   Guillain Barr syndrome (HCC) Guillain  barre syndrome involving lower extremities and upper extremities with significant improvement from admission 04/02/2018  Significant paresthesias and dysesthesias with significant pain in both hands and feet not well controlled with gabapentin at 400 mg 3 times daily  Plan Begin ibuprofen 800 mg every 8 hours as needed for pain Increase gabapentin to 600 mg 3 times daily x5 days then reduce back to 40 mg 3 times daily thereafter  We are hopeful the patient's insurance will go through so that she can obtain the  home-based physical therapy and Occupational Therapy  The patient is encouraged to keep her neurology appointment upcoming within a week  Folate deficiency Macrocytic anemia with subsequent folic acid deficiency  Continue folic acid replacement therapy  Follow-up CBC   Makeya was seen today for hospitalization follow-up.  Diagnoses and all orders for this visit:  Guillain Barr syndrome [facility]) -     Comprehensive metabolic panel -     CBC with Differential/Platelet; Future -     CBC with Differential/Platelet  Macrocytic anemia -     CBC with Differential/Platelet; Future -     CBC with Differential/Platelet  Folate deficiency  Other orders -     ibuprofen (ADVIL,MOTRIN) 800 MG tablet; Take 1 tablet (800 mg total) by mouth every 8 (eight) hours as needed. -     gabapentin (NEURONTIN) 300 MG capsule; Take 2 capsules (600 mg total) by mouth 3 (three) times daily.

## 2018-04-26 ENCOUNTER — Telehealth: Payer: Self-pay | Admitting: *Deleted

## 2018-04-26 LAB — COMPREHENSIVE METABOLIC PANEL
ALK PHOS: 86 IU/L (ref 39–117)
ALT: 17 IU/L (ref 0–32)
AST: 20 IU/L (ref 0–40)
Albumin/Globulin Ratio: 1 — ABNORMAL LOW (ref 1.2–2.2)
Albumin: 4 g/dL (ref 3.8–4.8)
BILIRUBIN TOTAL: 0.3 mg/dL (ref 0.0–1.2)
BUN/Creatinine Ratio: 11 (ref 9–23)
BUN: 7 mg/dL (ref 6–24)
CHLORIDE: 104 mmol/L (ref 96–106)
CO2: 20 mmol/L (ref 20–29)
Calcium: 9.8 mg/dL (ref 8.7–10.2)
Creatinine, Ser: 0.64 mg/dL (ref 0.57–1.00)
GFR calc Af Amer: 127 mL/min/{1.73_m2} (ref >59)
GFR calc non Af Amer: 110 mL/min/{1.73_m2} (ref >59)
Globulin, Total: 3.9 g/dL (ref 1.5–4.5)
Glucose: 88 mg/dL (ref 65–99)
Potassium: 3.9 mmol/L (ref 3.5–5.2)
Sodium: 141 mmol/L (ref 134–144)
Total Protein: 7.9 g/dL (ref 6.0–8.5)

## 2018-04-26 LAB — CBC WITH DIFFERENTIAL/PLATELET
BASOS ABS: 0 10*3/uL (ref 0.0–0.2)
Basos: 1 %
EOS (ABSOLUTE): 0.1 10*3/uL (ref 0.0–0.4)
Eos: 1 %
Hematocrit: 36 % (ref 34.0–46.6)
Hemoglobin: 12.3 g/dL (ref 11.1–15.9)
Immature Grans (Abs): 0 10*3/uL (ref 0.0–0.1)
Immature Granulocytes: 0 %
Lymphocytes Absolute: 2.5 10*3/uL (ref 0.7–3.1)
Lymphs: 42 %
MCH: 34.9 pg — ABNORMAL HIGH (ref 26.6–33.0)
MCHC: 34.2 g/dL (ref 31.5–35.7)
MCV: 102 fL — ABNORMAL HIGH (ref 79–97)
Monocytes Absolute: 0.5 10*3/uL (ref 0.1–0.9)
Monocytes: 8 %
NEUTROS PCT: 48 %
Neutrophils Absolute: 2.9 10*3/uL (ref 1.4–7.0)
Platelets: 238 10*3/uL (ref 150–450)
RBC: 3.52 x10E6/uL — ABNORMAL LOW (ref 3.77–5.28)
RDW: 13.2 % (ref 11.7–15.4)
WBC: 6 10*3/uL (ref 3.4–10.8)

## 2018-04-26 NOTE — Telephone Encounter (Signed)
MA unable to reach patient or leave a detailed message due to VM being full. !!!Please inform patient of labs being normal and to keep neurology appointment!!!

## 2018-04-26 NOTE — Telephone Encounter (Signed)
-----   Message from Storm Frisk, MD sent at 04/26/2018  8:17 AM EST ----- [place], let the pt know her blood counts and metabolic panel are all normal.  pls keep neurology appt

## 2018-04-28 ENCOUNTER — Ambulatory Visit: Payer: Self-pay | Admitting: Neurology

## 2018-04-28 NOTE — Progress Notes (Deleted)
[facility] Clinic Note - Initial Visit   Date: 04/28/18  Rebecca Goodman MRN: [id number] DOB: 11/08/1975   Dear Dr Aleen Campi***Tysinger, Kermit Baloavid S, PA-C:  Thank you for your kind referral of Rebecca Goodman for consultation of ***. Although *** history is well known to you, please allow us to reiterate it for the purpose of our medical record. The patient was accompanied to the clinic by *** who also provides collateral information.     History of Present Illness: Rebecca Goodman is a 43 y.o. ***-handed Caucasian/*** ***female with *** presenting for evaluation of ***.    [facility] with ***distal numbness/weakness and exam shows areflexia suggestive of Guillain-Barre syndrome.  MRI lumbar spine showed faint enhancement of the cauda equina nerve roots.  Patient refused LP and she was empirically treated with IVIG.   Out-side paper records, electronic medical record, and images have been reviewed where available and summarized as: *** MRI brain wwo contrast 04/03/2028: 1. Unremarkable appearance of the brain. 2. Motion degraded cervical spine MRI. No definite spinal cord abnormality. 3. Small C4-5 disc extrusion with mild right-sided spinal stenosis.  MRI lumbar spine wwo contrast 05/03/2017: 1. Faint contrast enhancement of the cauda equina nerve roots without clumping or enlargement. The findings are equivocal, but compatible with an autoimmune neuropathy such as Guillain-Barre syndrome in the reported clinical context. 2. No spinal canal stenosis or neural impingement.  Labs 04/03/2018:  Folate 3.4*, vitamin B12 552, TSH 2.037, HIV neg   Past Medical History:  Diagnosis Date  . Arthralgia of hip   . Arthralgia of knee   . Folate deficiency 04/05/2018  . Macrocytic anemia 04/05/2018  . Obesity     Past Surgical History:  Procedure Laterality Date  . WISDOM TOOTH EXTRACTION       Medications:  Outpatient Encounter Medications as of 04/28/2018    Medication Sig  . folic acid (FOLVITE) 1 MG tablet Take 1 tablet (1 mg total) by mouth daily.  Marland Kitchen. gabapentin (NEURONTIN) 300 MG capsule Take 2 capsules (600 mg total) by mouth 3 (three) times daily.  Marland Kitchen. ibuprofen (ADVIL,MOTRIN) 800 MG tablet Take 1 tablet (800 mg total) by mouth every 8 (eight) hours as needed.  Marc Morgans. Lidocaine-Aloe Vera (ALOE VERA/LIDOCAINE EX) Apply 1 application topically daily as needed (hand pain).  Marland Kitchen. oxyCODONE (OXY IR/ROXICODONE) 5 MG immediate release tablet Take 1-2 tablets (5-10 mg total) by mouth every 6 (six) hours as needed (take 1 tab for moderate pain, take 2 tabs for severe pain).   No facility-administered encounter medications on file as of 04/28/2018.      Allergies:  Allergies  Allergen Reactions  . Prednisone Other (See Comments)    IM injection caused severe anxiety, felt like heart attack    Family History: Family History  Problem Relation Age of Onset  . Hypertension Father   . Stroke Father 3861  . Diabetes Mother        borderline  . Diabetes Maternal Aunt   . Diabetes Maternal Grandmother   . Diabetes Maternal Aunt   . Sudden death Neg Hx   . Heart attack Neg Hx   . Hyperlipidemia Neg Hx   . Cancer Neg Hx   . Heart disease Neg Hx     Social History: Social History   Tobacco Use  . Smoking status: Current Every Day Smoker    Packs/day: 0.50    Types: Cigarettes  . Smokeless tobacco: Never Used  Substance Use Topics  .  Alcohol use: Yes    Alcohol/week: 2.0 standard drinks    Types: 1 Glasses of wine, 1 Shots of liquor per week  . Drug use: No   Social History   Social History Narrative   Daughter lives with her.   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   Exercise - walking, stretching, aerobics.      Review of Systems:  CONSTITUTIONAL: No fevers, chills, night sweats, or weight loss.  *** EYES: No visual changes or eye pain ENT: No hearing changes.  No history of nose bleeds.   RESPIRATORY: No cough, wheezing and shortness  of breath.   CARDIOVASCULAR: Negative for chest pain, and palpitations.   GI: Negative for abdominal discomfort, blood in stools or black stools.  No recent change in bowel habits.   GU:  No history of incontinence.   MUSCLOSKELETAL: No history of joint pain or swelling.  No myalgias.   SKIN: Negative for lesions, rash, and itching.   HEMATOLOGY/ONCOLOGY: Negative for prolonged bleeding, bruising easily, and swollen nodes.  No history of cancer.   ENDOCRINE: Negative for cold or heat intolerance, polydipsia or goiter.   PSYCH:  ***depression or anxiety symptoms.   NEURO: As Above.   Vital Signs:  LMP 04/20/2018 (Exact Date)  Pain Scale: *** on a scale of 0-10   General Medical Exam:  *** General:  Well appearing, comfortable.   Eyes/ENT: see cranial nerve examination.   Neck: No masses appreciated.  Full range of motion without tenderness.  No carotid bruits. Respiratory:  Clear to auscultation, good air entry bilaterally.   Cardiac:  Regular rate and rhythm, no murmur.   Extremities:  No deformities, edema, or skin discoloration.  Skin:  No rashes or lesions.  Neurological Exam: MENTAL STATUS including orientation to time, place, person, recent and remote memory, attention span and concentration, language, and fund of knowledge is ***normal.  Speech is not dysarthric.  CRANIAL NERVES: II:  No visual field defects.  Unremarkable fundi.   III-IV-VI: Pupils equal round and reactive to light.  Normal conjugate, extra-ocular eye movements in all directions of gaze.  No nystagmus.  No ptosis***.   V:  Normal facial sensation.  Jaw jerk is ***.   VII:  Normal facial symmetry and movements.  No pathologic facial reflexes.  VIII:  Normal hearing and vestibular function.   IX-X:  Normal palatal movement.   XI:  Normal shoulder shrug and head rotation.   XII:  Normal tongue strength and range of motion, no deviation or fasciculation.  MOTOR:  No atrophy, fasciculations or abnormal  movements.  No pronator drift.  Tone is normal.    Right Upper Extremity:    Left Upper Extremity:    Deltoid  5/5   Deltoid  5/5   Biceps  5/5   Biceps  5/5   Triceps  5/5   Triceps  5/5   Wrist extensors  5/5   Wrist extensors  5/5   Wrist flexors  5/5   Wrist flexors  5/5   Finger extensors  5/5   Finger extensors  5/5   Finger flexors  5/5   Finger flexors  5/5   Dorsal interossei  5/5   Dorsal interossei  5/5   Abductor pollicis  5/5   Abductor pollicis  5/5   Tone (Ashworth scale)  0  Tone (Ashworth scale)  0   Right Lower Extremity:    Left Lower Extremity:    Hip flexors  5/5  Hip flexors  5/5   Hip extensors  5/5   Hip extensors  5/5   Knee flexors  5/5   Knee flexors  5/5   Knee extensors  5/5   Knee extensors  5/5   Dorsiflexors  5/5   Dorsiflexors  5/5   Plantarflexors  5/5   Plantarflexors  5/5   Toe extensors  5/5   Toe extensors  5/5   Toe flexors  5/5   Toe flexors  5/5   Tone (Ashworth scale)  0  Tone (Ashworth scale)  0   MSRs:  Right                                                                 Left brachioradialis 2+  brachioradialis 2+  biceps 2+  biceps 2+  triceps 2+  triceps 2+  patellar 2+  patellar 2+  ankle jerk 2+  ankle jerk 2+  Hoffman no  Hoffman no  plantar response down  plantar response down   SENSORY:  Normal and symmetric perception of light touch, pinprick, vibration, and proprioception.  Romberg's sign absent.   COORDINATION/GAIT: Normal finger-to- nose-finger and heel-to-shin.  Intact rapid alternating movements bilaterally.  Able to rise from a chair without using arms.  Gait narrow based and stable. Tandem and stressed gait intact.    IMPRESSION: ***  PLAN/RECOMMENDATIONS:  *** Return to clinic in *** months.   The duration of this appointment visit was *** minutes of face-to-face time with the patient.  Greater than 50% of this time was spent in counseling, explanation of diagnosis, planning of further management, and  coordination of care.   Thank you for allowing me to participate in patient's care.  If I can answer any additional questions, I would be pleased to do so.    Sincerely,    Libia Fazzini K. Allena Katz, DO

## 2018-05-09 ENCOUNTER — Ambulatory Visit: Payer: Self-pay | Admitting: Critical Care Medicine

## 2018-05-09 NOTE — Progress Notes (Deleted)
Subjective:    Patient ID: Rebecca Goodman, female    DOB: 03/03/1976, 43 y.o.   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  The discharge summary is below  Distal symmetric polyneuropathy, progressive ascending weakness consistent with GBS.   Macrocytic anemia secondary to folate deficiency --Continue folicacid  History of present illness:  43 year old woman presenting with 3-week history of neurologic symptoms, numbness hands and feet, bilateral lower extremity weakness.  Hospital Course:  Admitted for progressive distal sensory symptoms and bilateral lower extremity weakness and ascending pattern concerning for GBS. She was seen by neurology and started on IVIG.  Gait and improved although she still has significant pain in her hands.  Distal symmetric polyneuropathy, progressive ascending weakness consistent with GBS. Patient refused lumbar puncture. MRI brain and cervical spine unrevealing. --IVIG finished today, total 5 days.  Continue Neurontin.   --Follow-up with neurology4-6 weeks for neuromuscular evaluation and EMG with Dr. Nita Sickleonika Patel --Short course of analgesics for hand pain.  Macrocytic anemia secondary to folate deficiency --Continue folicacid  Since discharge the patient has not yet begun physical therapy due to insurance barriers.  The patient complains of significant paresthesias and dysesthesias in both hands and feet.  The Neurontin at 400 mg 3 times daily is only minimally helping these symptoms.  Her level of weakness has improved however if she walks long distances her legs will lock up however she is not had any falls since being discharged.  05/09/2018 Seen in f/u from 1/21: Dx GBS  And macrocytic anemia. Had issues with accessing PT /OT in home.   Has not yet seen  neurology.    Past Medical History:  Diagnosis Date  . Arthralgia of hip   . Arthralgia of knee   . Folate deficiency 04/05/2018  . Macrocytic anemia 04/05/2018  . Obesity      Family History  Problem Relation Age of Onset  . Hypertension Father   . Stroke Father [id number]  . Diabetes Mother        borderline  . Diabetes Maternal Aunt   . Diabetes Maternal Grandmother   . Diabetes Maternal Aunt   . Sudden death Neg Hx   . Heart attack Neg Hx   . Hyperlipidemia Neg Hx   . Cancer Neg Hx   . Heart disease Neg Hx      Social History   Socioeconomic History  . Marital status: Single    Spouse name: Not on file  . Number of children: Not on file  . Years of education: Not on file  . Highest education level: Not on file  Occupational History  . Not on file  Social Needs  . Financial resource strain: Not on file  . Food insecurity:    Worry: Not on file    Inability: Not on file  . Transportation needs:    Medical: Not on file    Non-medical: Not on file  Tobacco Use  . Smoking status: Current Every Day Smoker    Packs/day: 0.50    Types: Cigarettes  . Smokeless tobacco: Never Used  Substance and Sexual Activity  . Alcohol use: Yes    Alcohol/week: 2.0 standard drinks    Types: 1 Glasses of wine, 1 Shots of liquor per week  . Drug use: No  . Sexual activity: Never    Birth  control/protection: None  Lifestyle  . Physical activity:    Days per week: Not on file    Minutes per session: Not on file  . Stress: Not on file  Relationships  . Social connections:    Talks on phone: Not on file    Gets together: Not on file    Attends religious service: Not on file    Active member of club or organization: Not on file    Attends meetings of clubs or organizations: Not on file    Relationship status: Not on file  . Intimate partner violence:    Fear of current or ex partner: Not on file    Emotionally abused: Not on file    Physically abused: Not on file    Forced sexual  activity: Not on file  Other Topics Concern  . Not on file  Social History Narrative   Daughter lives with her.   Works as Statistician.   Exercise - walking, stretching, aerobics.       Allergies  Allergen Reactions  . Prednisone Other (See Comments)    IM injection caused severe anxiety, felt like heart attack     Outpatient Medications Prior to Visit  Medication Sig Dispense Refill  . folic acid (FOLVITE) 1 MG tablet Take 1 tablet (1 mg total) by mouth daily. 30 tablet 1  . gabapentin (NEURONTIN) 300 MG capsule Take 2 capsules (600 mg total) by mouth 3 (three) times daily. 40 capsule 0  . ibuprofen (ADVIL,MOTRIN) 800 MG tablet Take 1 tablet (800 mg total) by mouth every 8 (eight) hours as needed. 30 tablet 0  . Lidocaine-Aloe Vera (ALOE VERA/LIDOCAINE EX) Apply 1 application topically daily as needed (hand pain).    Marland Kitchen oxyCODONE (OXY IR/ROXICODONE) 5 MG immediate release tablet Take 1-2 tablets (5-10 mg total) by mouth every 6 (six) hours as needed (take 1 tab for moderate pain, take 2 tabs for severe pain). 40 tablet 0   No facility-administered medications prior to visit.       Review of Systems  Constitutional: Negative.   HENT: Negative.   Respiratory: Negative.   Cardiovascular: Negative.   Gastrointestinal: Negative.   Genitourinary: Negative.   Musculoskeletal: Positive for gait problem. Negative for myalgias, neck pain and neck stiffness.  Skin: Negative.   Neurological: Positive for weakness, numbness and headaches. Negative for dizziness, tremors, seizures, facial asymmetry, speech difficulty and light-headedness.  Hematological: Negative.   Psychiatric/Behavioral: Negative.        Objective:   Physical Exam   LMP 04/20/2018 (Exact Date)   Gen: Pleasant, well-nourished, in no distress,  normal affect  ENT: No lesions,  mouth clear,  oropharynx clear, no postnasal drip  Neck: No JVD, no TMG, no carotid bruits  Lungs: No use of accessory  muscles, no dullness to percussion, clear without rales or rhonchi  Cardiovascular: RRR, heart sounds normal, no murmur or gallops, no peripheral edema  Abdomen: soft and NT, no HSM,  BS normal  Musculoskeletal: No deformities, no cyanosis or clubbing  Neuro: alert, upper extremities have good strength however the lower extremities have 4/5 giveaway weakness.  There is decreased sensation in the feet and in the palms of the hands.   Skin: Warm, no lesions or rashes BMP Latest Ref Rng & Units 04/25/2018 04/05/2018 04/04/2018  Glucose 65 - 99 mg/dL 88 734(L) 91  BUN 6 - 24 mg/dL 7 11 5(L)  Creatinine 9.37 - 1.00 mg/dL 9.02 4.09 7.35  BUN/Creat Ratio  9 - 23 11 - -  Sodium 134 - 144 mmol/L 141 135 139  Potassium 3.5 - 5.2 mmol/L 3.9 3.8 4.0  Chloride 96 - 106 mmol/L 104 103 107  CO2 20 - 29 mmol/L 20 25 24   Calcium 8.7 - 10.2 mg/dL 9.8 2.5(E) 5.2(D)   CBC Latest Ref Rng & Units 04/25/2018 04/05/2018 04/04/2018  WBC 3.4 - 10.8 x10E3/uL 6.0 6.4 6.7  Hemoglobin 11.1 - 15.9 g/dL 78.2 4.2(P) 5.3(I)  Hematocrit 34.0 - 46.6 % 36.0 26.1(L) 27.8(L)  Platelets 150 - 450 x10E3/uL 238 265 317       Assessment & Plan:  I personally reviewed all images and lab data in the [facility] system as well as any outside material available during this office visit and agree with the  radiology impressions.   No problem-specific Assessment & Plan notes found for this encounter.   There are no diagnoses linked to this encounter.

## 2020-01-15 ENCOUNTER — Emergency Department (HOSPITAL_COMMUNITY): Payer: Self-pay

## 2020-01-15 ENCOUNTER — Encounter (HOSPITAL_COMMUNITY): Payer: Self-pay | Admitting: Pulmonary Disease

## 2020-01-15 ENCOUNTER — Inpatient Hospital Stay (HOSPITAL_COMMUNITY)
Admission: EM | Admit: 2020-01-15 | Discharge: 2020-02-04 | DRG: 871 | Disposition: E | Payer: Self-pay | Attending: Pulmonary Disease | Admitting: Pulmonary Disease

## 2020-01-15 ENCOUNTER — Inpatient Hospital Stay (HOSPITAL_COMMUNITY): Payer: Self-pay

## 2020-01-15 DIAGNOSIS — Z515 Encounter for palliative care: Secondary | ICD-10-CM

## 2020-01-15 DIAGNOSIS — Z0189 Encounter for other specified special examinations: Secondary | ICD-10-CM

## 2020-01-15 DIAGNOSIS — D689 Coagulation defect, unspecified: Secondary | ICD-10-CM | POA: Diagnosis present

## 2020-01-15 DIAGNOSIS — E162 Hypoglycemia, unspecified: Secondary | ICD-10-CM | POA: Diagnosis present

## 2020-01-15 DIAGNOSIS — R579 Shock, unspecified: Secondary | ICD-10-CM

## 2020-01-15 DIAGNOSIS — K7010 Alcoholic hepatitis without ascites: Secondary | ICD-10-CM | POA: Diagnosis present

## 2020-01-15 DIAGNOSIS — Z833 Family history of diabetes mellitus: Secondary | ICD-10-CM

## 2020-01-15 DIAGNOSIS — E669 Obesity, unspecified: Secondary | ICD-10-CM | POA: Diagnosis present

## 2020-01-15 DIAGNOSIS — E876 Hypokalemia: Secondary | ICD-10-CM | POA: Diagnosis present

## 2020-01-15 DIAGNOSIS — D62 Acute posthemorrhagic anemia: Secondary | ICD-10-CM | POA: Diagnosis present

## 2020-01-15 DIAGNOSIS — F17210 Nicotine dependence, cigarettes, uncomplicated: Secondary | ICD-10-CM | POA: Diagnosis present

## 2020-01-15 DIAGNOSIS — E872 Acidosis, unspecified: Secondary | ICD-10-CM

## 2020-01-15 DIAGNOSIS — K922 Gastrointestinal hemorrhage, unspecified: Secondary | ICD-10-CM | POA: Diagnosis present

## 2020-01-15 DIAGNOSIS — Z20822 Contact with and (suspected) exposure to covid-19: Secondary | ICD-10-CM | POA: Diagnosis present

## 2020-01-15 DIAGNOSIS — Z978 Presence of other specified devices: Secondary | ICD-10-CM

## 2020-01-15 DIAGNOSIS — E7220 Disorder of urea cycle metabolism, unspecified: Secondary | ICD-10-CM

## 2020-01-15 DIAGNOSIS — Z4659 Encounter for fitting and adjustment of other gastrointestinal appliance and device: Secondary | ICD-10-CM

## 2020-01-15 DIAGNOSIS — R0600 Dyspnea, unspecified: Secondary | ICD-10-CM

## 2020-01-15 DIAGNOSIS — G9341 Metabolic encephalopathy: Secondary | ICD-10-CM | POA: Diagnosis present

## 2020-01-15 DIAGNOSIS — D529 Folate deficiency anemia, unspecified: Secondary | ICD-10-CM | POA: Diagnosis present

## 2020-01-15 DIAGNOSIS — R791 Abnormal coagulation profile: Secondary | ICD-10-CM

## 2020-01-15 DIAGNOSIS — E2740 Unspecified adrenocortical insufficiency: Secondary | ICD-10-CM | POA: Diagnosis present

## 2020-01-15 DIAGNOSIS — G610 Guillain-Barre syndrome: Secondary | ICD-10-CM | POA: Diagnosis present

## 2020-01-15 DIAGNOSIS — K7040 Alcoholic hepatic failure without coma: Secondary | ICD-10-CM | POA: Diagnosis present

## 2020-01-15 DIAGNOSIS — K7030 Alcoholic cirrhosis of liver without ascites: Secondary | ICD-10-CM | POA: Diagnosis present

## 2020-01-15 DIAGNOSIS — R4182 Altered mental status, unspecified: Secondary | ICD-10-CM | POA: Diagnosis present

## 2020-01-15 DIAGNOSIS — Z79899 Other long term (current) drug therapy: Secondary | ICD-10-CM

## 2020-01-15 DIAGNOSIS — Z7189 Other specified counseling: Secondary | ICD-10-CM

## 2020-01-15 DIAGNOSIS — K767 Hepatorenal syndrome: Secondary | ICD-10-CM | POA: Diagnosis present

## 2020-01-15 DIAGNOSIS — J690 Pneumonitis due to inhalation of food and vomit: Secondary | ICD-10-CM | POA: Diagnosis present

## 2020-01-15 DIAGNOSIS — Y95 Nosocomial condition: Secondary | ICD-10-CM | POA: Diagnosis present

## 2020-01-15 DIAGNOSIS — A419 Sepsis, unspecified organism: Principal | ICD-10-CM | POA: Diagnosis present

## 2020-01-15 DIAGNOSIS — K7682 Hepatic encephalopathy: Secondary | ICD-10-CM

## 2020-01-15 DIAGNOSIS — R34 Anuria and oliguria: Secondary | ICD-10-CM | POA: Diagnosis present

## 2020-01-15 DIAGNOSIS — Z9911 Dependence on respirator [ventilator] status: Secondary | ICD-10-CM

## 2020-01-15 DIAGNOSIS — E874 Mixed disorder of acid-base balance: Secondary | ICD-10-CM | POA: Diagnosis present

## 2020-01-15 DIAGNOSIS — N390 Urinary tract infection, site not specified: Secondary | ICD-10-CM | POA: Diagnosis present

## 2020-01-15 DIAGNOSIS — K760 Fatty (change of) liver, not elsewhere classified: Secondary | ICD-10-CM | POA: Diagnosis present

## 2020-01-15 DIAGNOSIS — R680 Hypothermia, not associated with low environmental temperature: Secondary | ICD-10-CM | POA: Diagnosis present

## 2020-01-15 DIAGNOSIS — D539 Nutritional anemia, unspecified: Secondary | ICD-10-CM | POA: Diagnosis present

## 2020-01-15 DIAGNOSIS — G629 Polyneuropathy, unspecified: Secondary | ICD-10-CM | POA: Diagnosis present

## 2020-01-15 DIAGNOSIS — K7290 Hepatic failure, unspecified without coma: Principal | ICD-10-CM

## 2020-01-15 DIAGNOSIS — Z6834 Body mass index (BMI) 34.0-34.9, adult: Secondary | ICD-10-CM

## 2020-01-15 DIAGNOSIS — N179 Acute kidney failure, unspecified: Secondary | ICD-10-CM | POA: Diagnosis present

## 2020-01-15 DIAGNOSIS — J9601 Acute respiratory failure with hypoxia: Secondary | ICD-10-CM | POA: Diagnosis present

## 2020-01-15 DIAGNOSIS — Z888 Allergy status to other drugs, medicaments and biological substances status: Secondary | ICD-10-CM

## 2020-01-15 DIAGNOSIS — D638 Anemia in other chronic diseases classified elsewhere: Secondary | ICD-10-CM | POA: Diagnosis present

## 2020-01-15 DIAGNOSIS — D6959 Other secondary thrombocytopenia: Secondary | ICD-10-CM | POA: Diagnosis present

## 2020-01-15 DIAGNOSIS — F10239 Alcohol dependence with withdrawal, unspecified: Secondary | ICD-10-CM | POA: Diagnosis present

## 2020-01-15 DIAGNOSIS — J90 Pleural effusion, not elsewhere classified: Secondary | ICD-10-CM | POA: Diagnosis present

## 2020-01-15 DIAGNOSIS — F1020 Alcohol dependence, uncomplicated: Secondary | ICD-10-CM

## 2020-01-15 DIAGNOSIS — Z66 Do not resuscitate: Secondary | ICD-10-CM | POA: Diagnosis present

## 2020-01-15 DIAGNOSIS — B9620 Unspecified Escherichia coli [E. coli] as the cause of diseases classified elsewhere: Secondary | ICD-10-CM | POA: Diagnosis present

## 2020-01-15 DIAGNOSIS — R6521 Severe sepsis with septic shock: Secondary | ICD-10-CM | POA: Diagnosis present

## 2020-01-15 DIAGNOSIS — E871 Hypo-osmolality and hyponatremia: Secondary | ICD-10-CM | POA: Diagnosis present

## 2020-01-15 DIAGNOSIS — T68XXXA Hypothermia, initial encounter: Secondary | ICD-10-CM

## 2020-01-15 HISTORY — DX: Guillain-Barre syndrome: G61.0

## 2020-01-15 LAB — ETHANOL: Alcohol, Ethyl (B): <10 mg/dL (ref <10)

## 2020-01-15 LAB — RAPID URINE DRUG SCREEN, HOSP PERFORMED
Amphetamines: NOT DETECTED
Barbiturates: NOT DETECTED
Benzodiazepines: NOT DETECTED
Cocaine: NOT DETECTED
Opiates: NOT DETECTED
Tetrahydrocannabinol: NOT DETECTED

## 2020-01-15 LAB — CBC WITH DIFFERENTIAL/PLATELET
Abs Immature Granulocytes: 0.11 10*3/uL — ABNORMAL HIGH (ref 0.00–0.07)
Basophils Absolute: 0 10*3/uL (ref 0.0–0.1)
Basophils Relative: 0 %
Eosinophils Absolute: 0 10*3/uL (ref 0.0–0.5)
Eosinophils Relative: 0 %
HCT: 28.8 % — ABNORMAL LOW (ref 36.0–46.0)
Hemoglobin: 9 g/dL — ABNORMAL LOW (ref 12.0–15.0)
Immature Granulocytes: 1 %
Lymphocytes Relative: 15 %
Lymphs Abs: 1.4 10*3/uL (ref 0.7–4.0)
MCH: 39.8 pg — ABNORMAL HIGH (ref 26.0–34.0)
MCHC: 31.3 g/dL (ref 30.0–36.0)
MCV: 127.4 fL — ABNORMAL HIGH (ref 80.0–100.0)
Monocytes Absolute: 0.3 10*3/uL (ref 0.1–1.0)
Monocytes Relative: 4 %
Neutro Abs: 7.3 10*3/uL (ref 1.7–7.7)
Neutrophils Relative %: 80 %
Platelets: 59 10*3/uL — ABNORMAL LOW (ref 150–400)
RBC: 2.26 MIL/uL — ABNORMAL LOW (ref 3.87–5.11)
RDW: 19.3 % — ABNORMAL HIGH (ref 11.5–15.5)
WBC: 9.2 10*3/uL (ref 4.0–10.5)
nRBC: 0.7 % — ABNORMAL HIGH (ref 0.0–0.2)

## 2020-01-15 LAB — I-STAT ARTERIAL BLOOD GAS, ED
Acid-base deficit: 13 mmol/L — ABNORMAL HIGH (ref 0.0–2.0)
Bicarbonate: 11.3 mmol/L — ABNORMAL LOW (ref 20.0–28.0)
Calcium, Ion: 0.98 mmol/L — ABNORMAL LOW (ref 1.15–1.40)
HCT: 23 % — ABNORMAL LOW (ref 36.0–46.0)
Hemoglobin: 7.8 g/dL — ABNORMAL LOW (ref 12.0–15.0)
O2 Saturation: 73 %
Patient temperature: 90.4
Potassium: 3.1 mmol/L — ABNORMAL LOW (ref 3.5–5.1)
Sodium: 130 mmol/L — ABNORMAL LOW (ref 135–145)
TCO2: 12 mmol/L — ABNORMAL LOW (ref 22–32)
pCO2 arterial: 16 mmHg — CL (ref 32.0–48.0)
pH, Arterial: 7.436 (ref 7.350–7.450)
pO2, Arterial: 28 mmHg — CL (ref 83.0–108.0)

## 2020-01-15 LAB — LIPASE, BLOOD: Lipase: 22 U/L (ref 11–51)

## 2020-01-15 LAB — BASIC METABOLIC PANEL
Anion gap: 36 — ABNORMAL HIGH (ref 5–15)
BUN: <5 mg/dL — ABNORMAL LOW (ref 6–20)
CO2: 9 mmol/L — ABNORMAL LOW (ref 22–32)
Calcium: 8.7 mg/dL — ABNORMAL LOW (ref 8.9–10.3)
Chloride: 86 mmol/L — ABNORMAL LOW (ref 98–111)
Creatinine, Ser: 1.1 mg/dL — ABNORMAL HIGH (ref 0.44–1.00)
GFR, Estimated: >60 mL/min (ref >60)
Glucose, Bld: <20 mg/dL — CL (ref 70–99)
Potassium: 3.4 mmol/L — ABNORMAL LOW (ref 3.5–5.1)
Sodium: 131 mmol/L — ABNORMAL LOW (ref 135–145)

## 2020-01-15 LAB — ACETAMINOPHEN LEVEL: Acetaminophen (Tylenol), Serum: <10 ug/mL — ABNORMAL LOW (ref 10–30)

## 2020-01-15 LAB — LACTIC ACID, PLASMA
Lactic Acid, Venous: >11 mmol/L (ref 0.5–1.9)
Lactic Acid, Venous: >11 mmol/L (ref 0.5–1.9)

## 2020-01-15 LAB — HEPATIC FUNCTION PANEL
ALT: 100 U/L — ABNORMAL HIGH (ref 0–44)
AST: 480 U/L — ABNORMAL HIGH (ref 15–41)
Albumin: 1.6 g/dL — ABNORMAL LOW (ref 3.5–5.0)
Alkaline Phosphatase: 202 U/L — ABNORMAL HIGH (ref 38–126)
Bilirubin, Direct: 7 mg/dL — ABNORMAL HIGH (ref 0.0–0.2)
Indirect Bilirubin: 6.2 mg/dL — ABNORMAL HIGH (ref 0.3–0.9)
Total Bilirubin: 13.2 mg/dL — ABNORMAL HIGH (ref 0.3–1.2)
Total Protein: 6.6 g/dL (ref 6.5–8.1)

## 2020-01-15 LAB — AMMONIA: Ammonia: 123 umol/L — ABNORMAL HIGH (ref 9–35)

## 2020-01-15 LAB — CBG MONITORING, ED
Glucose-Capillary: <10 mg/dL — CL (ref 70–99)
Glucose-Capillary: <10 mg/dL — CL (ref 70–99)
Glucose-Capillary: 105 mg/dL — ABNORMAL HIGH (ref 70–99)
Glucose-Capillary: 124 mg/dL — ABNORMAL HIGH (ref 70–99)
Glucose-Capillary: 88 mg/dL (ref 70–99)

## 2020-01-15 LAB — PROTIME-INR
INR: 5.1 (ref 0.8–1.2)
Prothrombin Time: 45.8 seconds — ABNORMAL HIGH (ref 11.4–15.2)

## 2020-01-15 LAB — I-STAT BETA HCG BLOOD, ED (MC, WL, AP ONLY): I-stat hCG, quantitative: <5 m[IU]/mL (ref <5)

## 2020-01-15 LAB — RESPIRATORY PANEL BY RT PCR (FLU A&B, COVID)
Influenza A by PCR: NEGATIVE
Influenza B by PCR: NEGATIVE
SARS Coronavirus 2 by RT PCR: NEGATIVE

## 2020-01-15 LAB — GLUCOSE, CAPILLARY: Glucose-Capillary: 70 mg/dL (ref 70–99)

## 2020-01-15 MED ORDER — LORAZEPAM 1 MG PO TABS: 0.0000 mg | ORAL_TABLET | Freq: Two times a day (BID) | ORAL | Status: DC

## 2020-01-15 MED ORDER — LACTULOSE 10 GM/15ML PO SOLN: 30.0000 g | Freq: Once | ORAL | Status: DC

## 2020-01-15 MED ORDER — LORAZEPAM 1 MG PO TABS: 0.0000 mg | ORAL_TABLET | Freq: Four times a day (QID) | ORAL | Status: DC

## 2020-01-15 MED ORDER — LORAZEPAM 2 MG/ML IJ SOLN
0.0000 mg | Freq: Four times a day (QID) | INTRAMUSCULAR | Status: DC
  Administered 2020-01-15: 1 mg via INTRAVENOUS
  Administered 2020-01-16: 0 mg via INTRAVENOUS
  Filled 2020-01-15: qty 1

## 2020-01-15 MED ORDER — DEXTROSE 50 % IV SOLN
1.0000 | Freq: Once | INTRAVENOUS | Status: AC
  Administered 2020-01-15: 50 mL via INTRAVENOUS
  Filled 2020-01-15: qty 50

## 2020-01-15 MED ORDER — LACTULOSE 10 GM/15ML PO SOLN
30.0000 g | ORAL | Status: DC
  Administered 2020-01-16 – 2020-01-17 (×7): 30 g
  Filled 2020-01-15 (×8): qty 45

## 2020-01-15 MED ORDER — THIAMINE HCL 100 MG/ML IJ SOLN
100.0000 mg | Freq: Once | INTRAMUSCULAR | Status: AC
  Administered 2020-01-15: 100 mg via INTRAVENOUS
  Filled 2020-01-15: qty 2

## 2020-01-15 MED ORDER — SODIUM CHLORIDE 0.9 % IV SOLN
2.0000 g | Freq: Once | INTRAVENOUS | Status: AC
  Administered 2020-01-15: 2 g via INTRAVENOUS
  Filled 2020-01-15: qty 2

## 2020-01-15 MED ORDER — FOLIC ACID 5 MG/ML IJ SOLN
1.0000 mg | Freq: Once | INTRAMUSCULAR | Status: AC
  Administered 2020-01-16: 1 mg via INTRAVENOUS
  Filled 2020-01-15: qty 0.2

## 2020-01-15 MED ORDER — ONDANSETRON HCL 4 MG/2ML IJ SOLN: 4.0000 mg | Freq: Four times a day (QID) | INTRAMUSCULAR | Status: DC | PRN

## 2020-01-15 MED ORDER — VANCOMYCIN HCL 1500 MG/300ML IV SOLN
1500.0000 mg | Freq: Once | INTRAVENOUS | Status: AC
  Administered 2020-01-15: 1500 mg via INTRAVENOUS
  Filled 2020-01-15: qty 300

## 2020-01-15 MED ORDER — SODIUM CHLORIDE 0.9 % IV BOLUS
1000.0000 mL | Freq: Once | INTRAVENOUS | Status: AC
  Administered 2020-01-15: 1000 mL via INTRAVENOUS

## 2020-01-15 MED ORDER — SODIUM CHLORIDE 0.9 % IV SOLN: 2.0000 g | INTRAVENOUS | Status: DC

## 2020-01-15 MED ORDER — SODIUM CHLORIDE 0.9 % IV SOLN: 1.0000 mg | Freq: Once | INTRAVENOUS | Status: DC

## 2020-01-15 MED ORDER — PANTOPRAZOLE SODIUM 40 MG IV SOLR
40.0000 mg | Freq: Every day | INTRAVENOUS | Status: DC
  Administered 2020-01-16 (×2): 40 mg via INTRAVENOUS
  Filled 2020-01-15 (×2): qty 40

## 2020-01-15 MED ORDER — VANCOMYCIN HCL 1.5 G IV SOLR
1500.0000 mg | Freq: Once | INTRAVENOUS | Status: DC
  Filled 2020-01-15: qty 1500

## 2020-01-15 MED ORDER — DEXTROSE 5 % IV SOLN: Freq: Once | INTRAVENOUS | Status: AC

## 2020-01-15 MED ORDER — THIAMINE HCL 100 MG/ML IJ SOLN
100.0000 mg | Freq: Every day | INTRAMUSCULAR | Status: DC
  Administered 2020-01-16: 100 mg via INTRAVENOUS
  Filled 2020-01-15: qty 2

## 2020-01-15 MED ORDER — THIAMINE HCL 100 MG PO TABS: 100.0000 mg | ORAL_TABLET | Freq: Every day | ORAL | Status: DC

## 2020-01-15 MED ORDER — SODIUM CHLORIDE 0.9 % IV SOLN: 2.0000 g | Freq: Three times a day (TID) | INTRAVENOUS | Status: DC

## 2020-01-15 MED ORDER — LACTULOSE ENEMA
300.0000 mL | Freq: Once | ORAL | Status: AC
  Administered 2020-01-15: 300 mL via RECTAL
  Filled 2020-01-15: qty 300

## 2020-01-15 MED ORDER — VANCOMYCIN HCL IN DEXTROSE 1-5 GM/200ML-% IV SOLN
1000.0000 mg | Freq: Two times a day (BID) | INTRAVENOUS | Status: DC
  Administered 2020-01-16 – 2020-01-17 (×2): 1000 mg via INTRAVENOUS
  Filled 2020-01-15 (×3): qty 200

## 2020-01-15 MED ORDER — LORAZEPAM 2 MG/ML IJ SOLN: 0.0000 mg | Freq: Two times a day (BID) | INTRAMUSCULAR | Status: DC

## 2020-01-15 MED ORDER — LORAZEPAM 2 MG/ML IJ SOLN: 2.0000 mg | INTRAMUSCULAR | Status: DC | PRN

## 2020-01-15 MED ORDER — IOHEXOL 300 MG/ML  SOLN
100.0000 mL | Freq: Once | INTRAMUSCULAR | Status: AC | PRN
  Administered 2020-01-15: 100 mL via INTRAVENOUS

## 2020-01-15 MED ORDER — POTASSIUM CHLORIDE 10 MEQ/100ML IV SOLN
10.0000 meq | INTRAVENOUS | Status: AC
  Administered 2020-01-16 (×2): 10 meq via INTRAVENOUS
  Filled 2020-01-15 (×2): qty 100

## 2020-01-15 NOTE — ED Provider Notes (Addendum)
[facility] EMERGENCY DEPARTMENT Provider Note   CSN: [id number] Arrival date & time: 02/01/2020  1820     History Chief Complaint  Patient presents with   Altered Mental Status    Rebecca Goodman is a 44 y.o. female.  Patient presents the emergency department for evaluation of altered mental status.  Level 5 caveat due to altered mentation.  Report from EMS, family reports that the patient has been altered for several days.  She has a history of Guillain-Barr syndrome.  Per EMS, multiple bottles of alcohol found in the house.  Patient has been confused, calling family the wrong names, unable to ambulate.  Blood sugar 336 per EMS.   Per patient's boyfriend, pt drinks heavily at baseline. They have been trying to cut back. She has decreased to 1-2 drinks per night over the past several nights.  Found beside the bed yesterday, needed help getting back into bed.  Earlier in the day today she needed assistance into the bed again.  This evening she was very confused, asking for people who weren't there, and EMS was called.        Past Medical History:  Diagnosis Date   Arthralgia of hip    Arthralgia of knee    Folate deficiency 04/05/2018   Macrocytic anemia 04/05/2018   Obesity     Patient Active Problem List   Diagnosis Date Noted   Folate deficiency 04/05/2018   Macrocytic anemia 04/05/2018   Guillain Barr syndrome (HCC) 04/02/2018    Past Surgical History:  Procedure Laterality Date   WISDOM TOOTH EXTRACTION       OB History   No obstetric history on file.     Family History  Problem Relation Age of Onset   Hypertension Father    Stroke Father 66   Diabetes Mother        borderline   Diabetes Maternal Aunt    Diabetes Maternal Grandmother    Diabetes Maternal Aunt    Sudden death Neg Hx    Heart attack Neg Hx    Hyperlipidemia Neg Hx    Cancer Neg Hx    Heart disease Neg Hx     Social History   Tobacco Use    Smoking status: Current Every Day Smoker    Packs/day: 0.50    Types: Cigarettes   Smokeless tobacco: Never Used  Substance Use Topics   Alcohol use: Yes    Alcohol/week: 2.0 standard drinks    Types: 1 Glasses of wine, 1 Shots of liquor per week   Drug use: No    Home Medications Prior to Admission medications   Medication Sig Start Date End Date Taking? Authorizing Provider  folic acid (FOLVITE) 1 MG tablet Take 1 tablet (1 mg total) by mouth daily. 04/08/18   Standley Brooking, MD  gabapentin (NEURONTIN) 300 MG capsule Take 2 capsules (600 mg total) by mouth 3 (three) times daily. 04/25/18   Storm Frisk, MD  ibuprofen (ADVIL,MOTRIN) 800 MG tablet Take 1 tablet (800 mg total) by mouth every 8 (eight) hours as needed. 04/25/18   Storm Frisk, MD  Lidocaine-Aloe Vera (ALOE VERA/LIDOCAINE EX) Apply 1 application topically daily as needed (hand pain).    [provider]  oxyCODONE (OXY IR/ROXICODONE) 5 MG immediate release tablet Take 1-2 tablets (5-10 mg total) by mouth every 6 (six) hours as needed (take 1 tab for moderate pain, take 2 tabs for severe pain). 04/07/18   Standley Brooking, MD  Allergies    Prednisone  Review of Systems   Review of Systems  Unable to perform ROS: Mental status change    Physical Exam Updated Vital Signs BP (!) 156/131 (BP Location: Right Arm)    Pulse (!) 102    Resp (!) 23    SpO2 97%   Physical Exam Vitals and nursing note reviewed.  Constitutional:      General: She is not in acute distress.    Appearance: She is well-developed. She is ill-appearing.  HENT:     Head: Normocephalic and atraumatic.     Right Ear: External ear normal.     Left Ear: External ear normal.     Nose: Nose normal.     Mouth/Throat:     Mouth: Mucous membranes are dry.     Comments: Very dry mucous membranes, ? Trace amount of blood on teeth.  Eyes:     General: Scleral icterus present.  Cardiovascular:     Rate and Rhythm: Normal rate and  regular rhythm.     Heart sounds: No murmur heard.   Pulmonary:     Effort: No respiratory distress.     Breath sounds: No wheezing, rhonchi or rales.  Abdominal:     Palpations: Abdomen is soft.     Tenderness: There is no guarding or rebound.  Musculoskeletal:     Cervical back: Normal range of motion and neck supple.     Right lower leg: No edema.     Left lower leg: No edema.  Skin:    General: Skin is warm and dry.     Coloration: Skin is jaundiced.     Findings: No rash.  Neurological:     Mental Status: She is lethargic and disoriented.     Comments: Questionable seizure activity resolved at time of initial exam. Patient responds to pain. Non-verbal. Moves all extremities. Some tremor noted.   Psychiatric:     Comments: Pt obtunded     ED Results / Procedures / Treatments   Labs (all labs ordered are listed, but only abnormal results are displayed) Labs Reviewed  CBC WITH DIFFERENTIAL/PLATELET - Abnormal; Notable for the following components:      Result Value   RBC 2.26 (*)    Hemoglobin 9.0 (*)    HCT 28.8 (*)    MCV 127.4 (*)    MCH 39.8 (*)    RDW 19.3 (*)    Platelets 59 (*)    nRBC 0.7 (*)    Abs Immature Granulocytes 0.11 (*)    All other components within normal limits  AMMONIA - Abnormal; Notable for the following components:   Ammonia 123 (*)    All other components within normal limits  LACTIC ACID, PLASMA - Abnormal; Notable for the following components:   Lactic Acid, Venous >11.0 (*)    All other components within normal limits  BASIC METABOLIC PANEL - Abnormal; Notable for the following components:   Sodium 131 (*)    Potassium 3.4 (*)    Chloride 86 (*)    CO2 9 (*)    Glucose, Bld <20 (*)    BUN <5 (*)    Creatinine, Ser 1.10 (*)    Calcium 8.7 (*)    Anion gap 36 (*)    All other components within normal limits  HEPATIC FUNCTION PANEL - Abnormal; Notable for the following components:   Albumin 1.6 (*)    AST 480 (*)    ALT 100 (*)  Alkaline Phosphatase 202 (*)    Total Bilirubin 13.2 (*)    Bilirubin, Direct 7.0 (*)    Indirect Bilirubin 6.2 (*)    All other components within normal limits  PROTIME-INR - Abnormal; Notable for the following components:   Prothrombin Time 45.8 (*)    INR 5.1 (*)    All other components within normal limits  ACETAMINOPHEN LEVEL - Abnormal; Notable for the following components:   Acetaminophen (Tylenol), Serum <10 (*)    All other components within normal limits  CBG MONITORING, ED - Abnormal; Notable for the following components:   Glucose-Capillary <10 (*)    All other components within normal limits  CBG MONITORING, ED - Abnormal; Notable for the following components:   Glucose-Capillary <10 (*)    All other components within normal limits  CBG MONITORING, ED - Abnormal; Notable for the following components:   Glucose-Capillary 124 (*)    All other components within normal limits  CBG MONITORING, ED - Abnormal; Notable for the following components:   Glucose-Capillary 105 (*)    All other components within normal limits  CULTURE, BLOOD (ROUTINE X 2)  CULTURE, BLOOD (ROUTINE X 2)  URINE CULTURE  RESPIRATORY PANEL BY RT PCR (FLU A&B, COVID)  ETHANOL  URINALYSIS, COMPLETE (UACMP) WITH MICROSCOPIC  RAPID URINE DRUG SCREEN, HOSP PERFORMED  I-STAT BETA HCG BLOOD, ED ([facility], [facility], [facility] ONLY)    ED ECG REPORT   Date: 01/17/2020  Rate: 91  Rhythm: normal sinus rhythm  QRS Axis: normal  Intervals: QT prolonged  ST/T Wave abnormalities: normal  Conduction Disutrbances:none  Narrative Interpretation:   Old EKG Reviewed: unchanged  I have personally reviewed the EKG tracing and agree with the computerized printout as noted.  Radiology DG Chest 1 View  Result Date: 01/22/2020 CLINICAL DATA:  Altered level of consciousness EXAM: CHEST  1 VIEW COMPARISON:  None. FINDINGS: Cardiac silhouette is accentuated by low lung volumes and portable technique. No visible pleural effusions or  pneumothorax. No acute osseous abnormality. IMPRESSION: No evidence of acute cardiopulmonary disease, although evaluation of the lung bases is limited by portable semi erect technique. Electronically Signed   By: Feliberto Harts MD   On: 01/18/2020 20:06    Procedures Procedures (including critical care time)  Medications Ordered in ED Medications  LORazepam (ATIVAN) injection 2 mg (has no administration in time range)  vancomycin (VANCOCIN) IVPB 1000 mg/200 mL premix (has no administration in time range)  vancomycin (VANCOREADY) IVPB 1500 mg/300 mL (1,500 mg Intravenous New Bag/Given 01/09/2020 2245)  LORazepam (ATIVAN) injection 0-4 mg (1 mg Intravenous Given 01/05/2020 2252)    Or  LORazepam (ATIVAN) tablet 0-4 mg ( Oral See Alternative 01/08/2020 2252)  LORazepam (ATIVAN) injection 0-4 mg (has no administration in time range)    Or  LORazepam (ATIVAN) tablet 0-4 mg (has no administration in time range)  thiamine tablet 100 mg (has no administration in time range)    Or  thiamine (B-1) injection 100 mg (has no administration in time range)  ondansetron (ZOFRAN) injection 4 mg (has no administration in time range)  pantoprazole (PROTONIX) injection 40 mg (has no administration in time range)  lactulose (CHRONULAC) 10 GM/15ML solution 30 g (has no administration in time range)  cefTRIAXone (ROCEPHIN) 2 g in sodium chloride 0.9 % 100 mL IVPB (has no administration in time range)  folic acid 1 mg in sodium chloride 0.9 % 50 mL IVPB (has no administration in time range)  potassium chloride 10 mEq in  100 mL IVPB (has no administration in time range)  thiamine (B-1) injection 100 mg (100 mg Intravenous Given 02/05/2020 2003)  dextrose 50 % solution 50 mL (50 mLs Intravenous Given 2020/02/05 2008)  ceFEPIme (MAXIPIME) 2 g in sodium chloride 0.9 % 100 mL IVPB (0 g Intravenous Stopped 02-05-20 2239)  lactulose (CHRONULAC) enema 200 gm (300 mLs Rectal Given 02-05-2020 2126)  sodium chloride 0.9 % bolus  1,000 mL (1,000 mLs Intravenous New Bag/Given February 05, 2020 2206)  dextrose 5 % solution ( Intravenous New Bag/Given 02-05-20 2142)    ED Course  I have reviewed the triage vital signs and the nursing notes.  Pertinent labs & imaging results that were available during my care of the patient were reviewed by me and considered in my medical decision making (see chart for details).  Patient seen and examined. Pt altered and encephalopathic, maintaining airway at this point.  She possibly had a seizure just prior to my exam.  Work-up initiated. Medications ordered.   Vital signs reviewed and are as follows: BP (!) 156/131 (BP Location: Right Arm)    Pulse (!) 102    Resp (!) 23    SpO2 97%   8:03 PM Pt rechecked. She is sleeping.   Notified by RN that CBG reading low. Will give amp D50 and recheck.   8:14 PM in addition, notified that patient's temperature is 90.4 F rectally.  Also critically high INR, lactate.  Ammonia in 123s.   Patient is critically ill.  Will start empiric antibiotics given temperature and elevated lactate.  Cultures have been ordered and sent.  30cc/kg bolus not ordered as sepsis possible but feel lactate and low temp likely related to hypoglycemia and seizure activity.   For hypothermia, TSH ordered as well as bear hugger.  For hyperammonemia, lactulose ordered.  This will need to be given per rectum as patient is unable to take orals or participate with NG tube placement.  BP (!) 156/131 (BP Location: Right Arm)    Pulse (!) 102    Temp (!) 90.4 F (32.4 C) (Rectal)    Resp (!) 23    SpO2 97%   8:43 PM Pt more alert with normalization in blood sugar, but not conversant. I spoke with patient's daughter Danna Hefty now at bedside.  She gave me the number the patient's boyfriend Molli Hazard who confirmed history ([phone]). Will discuss with GI and ICU.   9:07 PM Spoke with Dr. Russella Dar, GI will consult. Agrees with current plan, ICU involvement, no additional reccs.   Spoke  with [facility] MD by telephone.  They will send ground team.   2nd IV started in the left upper extremity by myself under ultrasound guidance.  EMERGENCY DEPARTMENT  US GUIDANCE EXAM Emergency Ultrasound:  US Guidance for Needle Guidance  INDICATIONS: Difficult vascular access Linear probe used in real-time to visualize location of needle entry through skin.   PERFORMED BY: Myself IMAGES ARCHIVED?: No LIMITATIONS: None VIEWS USED: Transverse INTERPRETATION: Needle visualized within vein, Left arm and Needle gauge 20   PCCM admitting.   Clinical Course as of Jan 16 2236  Tue 2020/02/05  2008 Lactic acid, plasma(!!) [MJ]  Wed Jan 16, 2020  1745 Blood gas, arterial [MJ]    Clinical Course User Index [MJ] Anda Latina   CRITICAL CARE Performed by: Renne Crigler PA-C Total critical care time: 60 minutes Critical care time was exclusive of separately billable procedures and treating other patients. Critical care was necessary to treat or  prevent imminent or life-threatening deterioration. Critical care was time spent personally by me on the following activities: development of treatment plan with patient and/or surrogate as well as nursing, discussions with consultants, evaluation of patient's response to treatment, examination of patient, obtaining history from patient or surrogate, ordering and performing treatments and interventions, ordering and review of laboratory studies, ordering and review of radiographic studies, pulse oximetry and re-evaluation of patient's condition.     MDM Rules/Calculators/A&P                          Admit.   Final Clinical Impression(s) / ED Diagnoses Final diagnoses:  Hepatic encephalopathy (HCC)  Hyperammonemia (HCC)  Alcoholism (HCC)  Elevated INR  Hyperbilirubinemia  Lactic acidosis  Hypothermia, initial encounter    Rx / DC Orders ED Discharge Orders    None       Renne CriglerGeiple, Morgaine Kimball, Cordelia Poche-C Nov 18, 2019 2256      Gwyneth SproutPlunkett, Whitney, MD 01/16/20 1348    Renne CriglerGeiple, Elgie Landino, PA-C 01/16/20 2237    Gwyneth SproutPlunkett, Whitney, MD 01/17/2020 [phone]

## 2020-01-15 NOTE — H&P (Signed)
NAME:  Rebecca Goodman, MRN:  [id number], DOB:  20-Dec-1975, LOS: 0 ADMISSION DATE:  01/09/2020, CONSULTATION DATE: 10/12 REFERRING MD: Dr. Anitra Lauth EDP, CHIEF COMPLAINT: Altered mental status  Brief History   44 year old female with history of alcohol abuse presents with altered mental status times days.  In ED found to have hepatic failure with lactic acid greater than 11 and was lethargic.  PCCM asked to admit.  History of present illness   Patient is encephalopathic and/or intubated. Therefore history has been obtained from chart review. 44 year old female with past medical history as below, which is significant for DM Barr syndrome and alcohol abuse.  Per family reports she typically drinks quite heavily, but had been attempting to cut down on drinking and was down to 1-2 drinks per day.  EMS was dispatched on 10/12 in the late evening hours due to altered mental status times several days.  It got to the point she was quite delirious and was unable to recognize family members.  She had been found on the floor beside the bed more than once in the preceding 24 hours.  Upon arrival to the emergency department she was quite lethargic and and there was questionable seizure activity.  CBG reading was "low".  She was hypothermic with temperature 90.4 F.  Laboratory evaluation was significant for lactic acid greater than 11, ammonia 123, AST 480, ALT 100, total bilirubin 13.2, INR 5.1, and alcohol level undetectable.  Hypothermia raise concern for sepsis and she was started on empiric antibiotics.  CT imaging was unable to be done initially due to patient's ability to cooperate.  Gastroenterology was consulted for liver failure and PCCM was asked to admit.  Past Medical History   has a past medical history of Arthralgia of hip, Arthralgia of knee, Folate deficiency (04/05/2018), Guillain Barr syndrome (HCC), Macrocytic anemia (04/05/2018), and Obesity.   Significant Hospital Events   10/12  admit  Consults:  Gastroenterology  Procedures:    Significant Diagnostic Tests:  CT head 10/12> CT abdomen pelvis 10/12>  Micro Data:  Blood 10/12> Urine 10/12>  Antimicrobials:  Cefepime 10/12 Ceftriaxone 10/12 > Vancomycin 10/12>  Interim history/subjective:    Objective   Blood pressure 114/87, pulse 91, temperature (!) 90.4 F (32.4 C), temperature source Oral, resp. rate (!) 26, SpO2 100 %.       No intake or output data in the 24 hours ending 01/29/2020 2219 There were no vitals filed for this visit.  Examination: General: Middle-aged female resting comfortably in no acute distress HENT: Normocephalic, atraumatic.  Scleral icterus. Lungs: Clear bilateral breath sounds Cardiovascular: Regular rate and rhythm.  No murmurs/rubs Abdomen: Protuberant, soft, generalized tenderness Extremities: No acute deformity, moving all extremities, bilateral lower extremity 1+ nonpitting edema. Neuro: Awake, alert, disoriented.  Answers all questions with "okay" GU: Macerated skin in the perineal area and bilateral inguinal creases.  Resolved Hospital Problem list     Assessment & Plan:   Acute metabolic encephalopathy: Likely hepatic encephalopathy with ammonia 123 in the setting of liver failure.  Cannot rule out encephalopathy related to sepsis or alcohol withdrawal. Also with possible seizure activity she could be postictal. -Admit to ICU for close monitoring -Lactulose -CT head -Trend ammonia  Hepatic failure: Based on reported history this is most likely due to alcohol use. Tylenol level undetectable.  -Gastroenterology consult -Liver failure work-up including hepatitis panel -Lactulose -CT abdomen/pelvis -no role for N-AC -Trend LFT, Bilirubin  SIRS/Sepsis: Source unclear. ?SBP - CT abd - Empiric ceftriaxone/Vancomycin as  above - Blood, urine cultures  Anion gap acidosis: lactic acidosis, slow to clear due to liver failure - Follow  BMP  AKI Hypokalemia - IV K supplementation - Will need  Coagulopathy secondary to hepatic failure - No evidence of bleeding - Trend INR - No role for FFP, vitamin K  Hypoglycemia - CBG monitoring - D5 IVF  At risk of alcohol withdrawal - CIWA protocol - Seizure precautions - Thiamine - Folic acid   Best practice:  Diet: NPO Pain/Anxiety/Delirium protocol (if indicated): NA VAP protocol (if indicated): NA DVT prophylaxis: NA GI prophylaxis: PPI Glucose control: CBG monitoring Mobility: Bedrest Code Status: Full Family Communication: Family updated via phone Disposition: ICU  Labs   CBC: Recent Labs  Lab 02-07-2020 1909  WBC 9.2  NEUTROABS 7.3  HGB 9.0*  HCT 28.8*  MCV 127.4*  PLT 59*    Basic Metabolic Panel: Recent Labs  Lab 2020/02/07 1909  NA 131*  K 3.4*  CL 86*  CO2 9*  GLUCOSE <20*  BUN <5*  CREATININE 1.10*  CALCIUM 8.7*   GFR: CrCl cannot be calculated (Unknown ideal weight.). Recent Labs  Lab Feb 07, 2020 1904 Feb 07, 2020 1909  WBC  --  9.2  LATICACIDVEN >11.0*  --     Liver Function Tests: Recent Labs  Lab 02/07/2020 1909  AST 480*  ALT 100*  ALKPHOS 202*  BILITOT 13.2*  PROT 6.6  ALBUMIN 1.6*   No results for input(s): LIPASE, AMYLASE in the last 168 hours. Recent Labs  Lab 02-07-2020 1904  AMMONIA 123*    ABG No results found for: PHART, PCO2ART, PO2ART, HCO3, TCO2, ACIDBASEDEF, O2SAT   Coagulation Profile: Recent Labs  Lab 07-Feb-2020 1909  INR 5.1*    Cardiac Enzymes: No results for input(s): CKTOTAL, CKMB, CKMBINDEX, TROPONINI in the last 168 hours.  HbA1C: No results found for: HGBA1C  CBG: Recent Labs  Lab 2020-02-07 1959 02/07/2020 2005 2020-02-07 2030 02-07-2020 2049 2020-02-07 2129  GLUCAP <10* <10* 124* 105* 88    Review of Systems:   Patient is encephalopathic and/or intubated. Therefore history has been obtained from chart review.  Patient answers all questions with "Okay"  Past Medical History  She,   has a past medical history of Arthralgia of hip, Arthralgia of knee, Folate deficiency (04/05/2018), Guillain Barr syndrome (HCC), Macrocytic anemia (04/05/2018), and Obesity.   Surgical History    Past Surgical History:  Procedure Laterality Date  . WISDOM TOOTH EXTRACTION       Social History   reports that she has been smoking cigarettes. She has been smoking about 0.50 packs per day. She has never used smokeless tobacco. She reports current alcohol use of about 2.0 standard drinks of alcohol per week. She reports that she does not use drugs.   Family History   Her family history includes Diabetes in her maternal aunt, maternal aunt, maternal grandmother, and mother; Hypertension in her father; Stroke (age of onset: 33) in her father. There is no history of Sudden death, Heart attack, Hyperlipidemia, Cancer, or Heart disease.   Allergies Allergies  Allergen Reactions  . Prednisone Other (See Comments)    IM injection caused severe anxiety, felt like heart attack     Home Medications  Prior to Admission medications   Medication Sig Start Date End Date Taking? Authorizing Provider  folic acid (FOLVITE) 1 MG tablet Take 1 tablet (1 mg total) by mouth daily. 04/08/18   Standley Brooking, MD  gabapentin (NEURONTIN) 300 MG capsule Take 2 capsules (  600 mg total) by mouth 3 (three) times daily. 04/25/18   Storm Frisk, MD  ibuprofen (ADVIL,MOTRIN) 800 MG tablet Take 1 tablet (800 mg total) by mouth every 8 (eight) hours as needed. 04/25/18   Storm Frisk, MD  Lidocaine-Aloe Vera (ALOE VERA/LIDOCAINE EX) Apply 1 application topically daily as needed (hand pain).    [provider]  oxyCODONE (OXY IR/ROXICODONE) 5 MG immediate release tablet Take 1-2 tablets (5-10 mg total) by mouth every 6 (six) hours as needed (take 1 tab for moderate pain, take 2 tabs for severe pain). 04/07/18   Standley Brooking, MD     Critical care time: 40 minutes      Joneen Roach,  AGACNP-BC [facility]  See Amion for personal pager PCCM on call pager [phone]  2020-01-26 10:58 PM

## 2020-01-15 NOTE — Progress Notes (Signed)
eLink Physician-Brief Progress Note Patient Name: Rebecca Goodman DOB: 08-Jun-1975 MRN: [id number]   Date of Service  02/03/2020  HPI/Events of Note  Patient admitted with altered mental status, hepatic failure,  Hypoglycemia, hypothermia, r/o sepsis.  She has a history of heavy ETOH use.  eICU Interventions  New Patient Evaluation completed.        Thomasene Lot Khalib Fendley 01/18/2020, 11:31 PM

## 2020-01-15 NOTE — ED Triage Notes (Signed)
Per ems they were called out because pt hasnt been acting like herself for a couple days and has been getting worse. Pt has a drinking problem and also an auto immune disorder. Patient eyes are yellow. Bf States patient normally can walk around with a walker but hasn't been able to do any of that. When ems arrived patient was sitting on the floor tensed up and wouldn't acknowledge them and kept rocking back and forth. Patient also not able to follow commands. Patient will open her eyes if you call her name. GCS 9 with ems.

## 2020-01-15 NOTE — ED Notes (Addendum)
This RN notified provider Geiple PA-C of critical Lactic/CBG/ PT/INR lab values and Temperature of 90.37f rectal.

## 2020-01-15 NOTE — Progress Notes (Signed)
Pharmacy Antibiotic Note  Rebecca Goodman is a 44 y.o. female admitted on 01/08/2020 with sepsis.  Pharmacy has been consulted for vancomycin and cefepime dosing.  Plan: Vancomycin 1500 mg IV x 1, then 1000 mg IV every 12 hours (target vancomycin trough 15-20) Cefepime 2g IV every 8 hours Monitor renal function, Cx and clinical progression to narrow Vancomycin trough at steady state     Temp (24hrs), Avg:90.4 F (32.4 C), Min:90.4 F (32.4 C), Max:90.4 F (32.4 C)  Recent Labs  Lab 01/07/2020 1904 01/14/2020 1909  WBC  --  9.2  CREATININE  --  1.10*  LATICACIDVEN >11.0*  --     CrCl cannot be calculated (Unknown ideal weight.).    Allergies  Allergen Reactions  . Prednisone Other (See Comments)    IM injection caused severe anxiety, felt like heart attack    Daylene Posey, PharmD Clinical Pharmacist ED Pharmacist Phone # [phone] 01/24/2020 8:30 PM

## 2020-01-16 ENCOUNTER — Inpatient Hospital Stay (HOSPITAL_COMMUNITY): Payer: Self-pay

## 2020-01-16 DIAGNOSIS — R579 Shock, unspecified: Secondary | ICD-10-CM

## 2020-01-16 DIAGNOSIS — K7201 Acute and subacute hepatic failure with coma: Secondary | ICD-10-CM

## 2020-01-16 DIAGNOSIS — R4182 Altered mental status, unspecified: Secondary | ICD-10-CM

## 2020-01-16 LAB — GLUCOSE, CAPILLARY
Glucose-Capillary: 127 mg/dL — ABNORMAL HIGH (ref 70–99)
Glucose-Capillary: 134 mg/dL — ABNORMAL HIGH (ref 70–99)
Glucose-Capillary: 145 mg/dL — ABNORMAL HIGH (ref 70–99)
Glucose-Capillary: 146 mg/dL — ABNORMAL HIGH (ref 70–99)
Glucose-Capillary: 154 mg/dL — ABNORMAL HIGH (ref 70–99)
Glucose-Capillary: 164 mg/dL — ABNORMAL HIGH (ref 70–99)
Glucose-Capillary: 61 mg/dL — ABNORMAL LOW (ref 70–99)
Glucose-Capillary: 78 mg/dL (ref 70–99)

## 2020-01-16 LAB — BASIC METABOLIC PANEL
Anion gap: 34 — ABNORMAL HIGH (ref 5–15)
Anion gap: 30 — ABNORMAL HIGH (ref 5–15)
BUN: 7 mg/dL (ref 6–20)
BUN: 6 mg/dL (ref 6–20)
CO2: 9 mmol/L — ABNORMAL LOW (ref 22–32)
CO2: 18 mmol/L — ABNORMAL LOW (ref 22–32)
Calcium: 8.4 mg/dL — ABNORMAL LOW (ref 8.9–10.3)
Calcium: 8.9 mg/dL (ref 8.9–10.3)
Chloride: 90 mmol/L — ABNORMAL LOW (ref 98–111)
Chloride: 90 mmol/L — ABNORMAL LOW (ref 98–111)
Creatinine, Ser: 1.22 mg/dL — ABNORMAL HIGH (ref 0.44–1.00)
Creatinine, Ser: 1.57 mg/dL — ABNORMAL HIGH (ref 0.44–1.00)
GFR, Estimated: 54 mL/min — ABNORMAL LOW (ref >60)
GFR, Estimated: 40 mL/min — ABNORMAL LOW (ref >60)
Glucose, Bld: 60 mg/dL — ABNORMAL LOW (ref 70–99)
Glucose, Bld: 180 mg/dL — ABNORMAL HIGH (ref 70–99)
Potassium: 3.7 mmol/L (ref 3.5–5.1)
Potassium: 2.4 mmol/L — CL (ref 3.5–5.1)
Sodium: 133 mmol/L — ABNORMAL LOW (ref 135–145)
Sodium: 138 mmol/L (ref 135–145)

## 2020-01-16 LAB — HEPATITIS PANEL, ACUTE
HCV Ab: NONREACTIVE
Hep A IgM: NONREACTIVE
Hep B C IgM: NONREACTIVE
Hepatitis B Surface Ag: NONREACTIVE

## 2020-01-16 LAB — CBC
HCT: 22.9 % — ABNORMAL LOW (ref 36.0–46.0)
HCT: 18.8 % — ABNORMAL LOW (ref 36.0–46.0)
Hemoglobin: 7.2 g/dL — ABNORMAL LOW (ref 12.0–15.0)
Hemoglobin: 6 g/dL — CL (ref 12.0–15.0)
MCH: 40 pg — ABNORMAL HIGH (ref 26.0–34.0)
MCH: 39.5 pg — ABNORMAL HIGH (ref 26.0–34.0)
MCHC: 31.4 g/dL (ref 30.0–36.0)
MCHC: 31.9 g/dL (ref 30.0–36.0)
MCV: 127.2 fL — ABNORMAL HIGH (ref 80.0–100.0)
MCV: 123.7 fL — ABNORMAL HIGH (ref 80.0–100.0)
Platelets: 68 10*3/uL — ABNORMAL LOW (ref 150–400)
Platelets: 76 10*3/uL — ABNORMAL LOW (ref 150–400)
RBC: 1.8 MIL/uL — ABNORMAL LOW (ref 3.87–5.11)
RBC: 1.52 MIL/uL — ABNORMAL LOW (ref 3.87–5.11)
RDW: 19.3 % — ABNORMAL HIGH (ref 11.5–15.5)
RDW: 19.2 % — ABNORMAL HIGH (ref 11.5–15.5)
WBC: 8.8 10*3/uL (ref 4.0–10.5)
WBC: 13 10*3/uL — ABNORMAL HIGH (ref 4.0–10.5)
nRBC: 1.1 % — ABNORMAL HIGH (ref 0.0–0.2)
nRBC: 1.1 % — ABNORMAL HIGH (ref 0.0–0.2)

## 2020-01-16 LAB — HEPATIC FUNCTION PANEL
ALT: 107 U/L — ABNORMAL HIGH (ref 0–44)
AST: 841 U/L — ABNORMAL HIGH (ref 15–41)
Albumin: 2 g/dL — ABNORMAL LOW (ref 3.5–5.0)
Alkaline Phosphatase: 147 U/L — ABNORMAL HIGH (ref 38–126)
Bilirubin, Direct: 7.1 mg/dL — ABNORMAL HIGH (ref 0.0–0.2)
Indirect Bilirubin: 5.7 mg/dL — ABNORMAL HIGH (ref 0.3–0.9)
Total Bilirubin: 12.8 mg/dL — ABNORMAL HIGH (ref 0.3–1.2)
Total Protein: 6.1 g/dL — ABNORMAL LOW (ref 6.5–8.1)

## 2020-01-16 LAB — PROTIME-INR
INR: 8 (ref 0.8–1.2)
INR: 3.4 — ABNORMAL HIGH (ref 0.8–1.2)
Prothrombin Time: 64.9 seconds — ABNORMAL HIGH (ref 11.4–15.2)
Prothrombin Time: 33.6 seconds — ABNORMAL HIGH (ref 11.4–15.2)

## 2020-01-16 LAB — BLOOD GAS, ARTERIAL
Acid-base deficit: 12.3 mmol/L — ABNORMAL HIGH (ref 0.0–2.0)
Acid-base deficit: 6.3 mmol/L — ABNORMAL HIGH (ref 0.0–2.0)
Bicarbonate: 14.6 mmol/L — ABNORMAL LOW (ref 20.0–28.0)
Bicarbonate: 18.1 mmol/L — ABNORMAL LOW (ref 20.0–28.0)
FIO2: 100
FIO2: 60
O2 Saturation: 98.7 %
O2 Saturation: 89.4 %
Patient temperature: 37
Patient temperature: 37
pCO2 arterial: 41.7 mmHg (ref 32.0–48.0)
pCO2 arterial: 32.7 mmHg (ref 32.0–48.0)
pH, Arterial: 7.171 — CL (ref 7.350–7.450)
pH, Arterial: 7.362 (ref 7.350–7.450)
pO2, Arterial: 221 mmHg — ABNORMAL HIGH (ref 83.0–108.0)
pO2, Arterial: 69.2 mmHg — ABNORMAL LOW (ref 83.0–108.0)

## 2020-01-16 LAB — LACTIC ACID, PLASMA
Lactic Acid, Venous: >11 mmol/L (ref 0.5–1.9)
Lactic Acid, Venous: >11 mmol/L (ref 0.5–1.9)
Lactic Acid, Venous: >11 mmol/L (ref 0.5–1.9)

## 2020-01-16 LAB — OSMOLALITY: Osmolality: 306 mOsm/kg — ABNORMAL HIGH (ref 275–295)

## 2020-01-16 LAB — PROCALCITONIN
Procalcitonin: 0.51 ng/mL
Procalcitonin: 0.54 ng/mL

## 2020-01-16 LAB — HIV ANTIBODY (ROUTINE TESTING W REFLEX): HIV Screen 4th Generation wRfx: NONREACTIVE

## 2020-01-16 LAB — PREPARE RBC (CROSSMATCH)

## 2020-01-16 LAB — MAGNESIUM: Magnesium: 2.3 mg/dL (ref 1.7–2.4)

## 2020-01-16 LAB — MRSA PCR SCREENING: MRSA by PCR: NEGATIVE

## 2020-01-16 LAB — ABO/RH
ABO/RH(D): O POS
ABO/RH(D): O POS

## 2020-01-16 LAB — PHOSPHORUS: Phosphorus: 4.1 mg/dL (ref 2.5–4.6)

## 2020-01-16 MED ORDER — CHLORHEXIDINE GLUCONATE 0.12% ORAL RINSE (MEDLINE KIT)
15.0000 mL | Freq: Two times a day (BID) | OROMUCOSAL | Status: DC
  Administered 2020-01-16 – 2020-01-19 (×6): 15 mL via OROMUCOSAL

## 2020-01-16 MED ORDER — SODIUM CHLORIDE 0.9% IV SOLUTION: Freq: Once | INTRAVENOUS | Status: AC

## 2020-01-16 MED ORDER — ROCURONIUM BROMIDE 50 MG/5ML IV SOLN
100.0000 mg | Freq: Once | INTRAVENOUS | Status: AC
  Administered 2020-01-16: 100 mg via INTRAVENOUS
  Filled 2020-01-16: qty 10

## 2020-01-16 MED ORDER — FENTANYL CITRATE (PF) 100 MCG/2ML IJ SOLN
INTRAMUSCULAR | Status: AC
  Filled 2020-01-16: qty 2

## 2020-01-16 MED ORDER — SODIUM BICARBONATE 8.4 % IV SOLN
100.0000 meq | Freq: Once | INTRAVENOUS | Status: AC
  Administered 2020-01-16: 100 meq via INTRAVENOUS
  Filled 2020-01-16: qty 100

## 2020-01-16 MED ORDER — NOREPINEPHRINE 4 MG/250ML-% IV SOLN
0.0000 ug/min | INTRAVENOUS | Status: DC
  Administered 2020-01-16: 11 ug/min via INTRAVENOUS
  Administered 2020-01-16: 5.333 ug/min via INTRAVENOUS
  Administered 2020-01-16: 10 ug/min via INTRAVENOUS
  Administered 2020-01-17: 12 ug/min via INTRAVENOUS
  Administered 2020-01-17: 4 ug/min via INTRAVENOUS
  Administered 2020-01-18: 3 ug/min via INTRAVENOUS
  Administered 2020-01-18: 14 ug/min via INTRAVENOUS
  Administered 2020-01-19 (×2): 35 ug/min via INTRAVENOUS
  Administered 2020-01-19: 25 ug/min via INTRAVENOUS
  Filled 2020-01-16 (×6): qty 250
  Filled 2020-01-16: qty 500
  Filled 2020-01-16 (×5): qty 250

## 2020-01-16 MED ORDER — THIAMINE HCL 100 MG PO TABS
100.0000 mg | ORAL_TABLET | Freq: Every day | ORAL | Status: DC
  Administered 2020-01-17 – 2020-01-18 (×2): 100 mg
  Filled 2020-01-16 (×4): qty 1

## 2020-01-16 MED ORDER — DEXMEDETOMIDINE HCL IN NACL 400 MCG/100ML IV SOLN
0.2000 ug/kg/h | INTRAVENOUS | Status: DC
  Administered 2020-01-16: 0.2 ug/kg/h via INTRAVENOUS
  Administered 2020-01-17: 0.4 ug/kg/h via INTRAVENOUS
  Administered 2020-01-17: 1 ug/kg/h via INTRAVENOUS
  Administered 2020-01-17: 0.7 ug/kg/h via INTRAVENOUS
  Filled 2020-01-16: qty 100
  Filled 2020-01-16: qty 200
  Filled 2020-01-16 (×3): qty 100

## 2020-01-16 MED ORDER — THIAMINE HCL 100 MG/ML IJ SOLN
500.0000 mg | Freq: Once | INTRAVENOUS | Status: AC
  Administered 2020-01-16: 500 mg via INTRAVENOUS
  Filled 2020-01-16: qty 5

## 2020-01-16 MED ORDER — CHLORHEXIDINE GLUCONATE CLOTH 2 % EX PADS
6.0000 | MEDICATED_PAD | Freq: Every day | CUTANEOUS | Status: DC
  Administered 2020-01-17 – 2020-01-19 (×3): 6 via TOPICAL

## 2020-01-16 MED ORDER — DEXTROSE 50 % IV SOLN: 12.5000 g | INTRAVENOUS | Status: AC

## 2020-01-16 MED ORDER — ALBUMIN HUMAN 5 % IV SOLN
25.0000 g | Freq: Once | INTRAVENOUS | Status: AC
  Administered 2020-01-16: 25 g via INTRAVENOUS
  Filled 2020-01-16: qty 500

## 2020-01-16 MED ORDER — SODIUM BICARBONATE 8.4 % IV SOLN
100.0000 meq | Freq: Once | INTRAVENOUS | Status: AC
  Administered 2020-01-16: 100 meq via INTRAVENOUS

## 2020-01-16 MED ORDER — FENTANYL CITRATE (PF) 100 MCG/2ML IJ SOLN
50.0000 ug | Freq: Four times a day (QID) | INTRAMUSCULAR | Status: DC | PRN
  Administered 2020-01-16: 50 ug via INTRAVENOUS
  Filled 2020-01-16: qty 2

## 2020-01-16 MED ORDER — FOLIC ACID 1 MG PO TABS
1.0000 mg | ORAL_TABLET | Freq: Every day | ORAL | Status: DC
  Administered 2020-01-16 – 2020-01-18 (×3): 1 mg
  Filled 2020-01-16 (×4): qty 1

## 2020-01-16 MED ORDER — MIDAZOLAM HCL 2 MG/2ML IJ SOLN
INTRAMUSCULAR | Status: AC
  Filled 2020-01-16: qty 4

## 2020-01-16 MED ORDER — VASOPRESSIN 20 UNITS/100 ML INFUSION FOR SHOCK
0.0000 [IU]/min | INTRAVENOUS | Status: DC
  Administered 2020-01-16 – 2020-01-19 (×6): 0.03 [IU]/min via INTRAVENOUS
  Filled 2020-01-16 (×7): qty 100

## 2020-01-16 MED ORDER — NOREPINEPHRINE 4 MG/250ML-% IV SOLN
INTRAVENOUS | Status: AC
  Filled 2020-01-16: qty 250

## 2020-01-16 MED ORDER — SODIUM BICARBONATE 8.4 % IV SOLN
INTRAVENOUS | Status: DC
  Filled 2020-01-16 (×3): qty 850

## 2020-01-16 MED ORDER — ADULT MULTIVITAMIN W/MINERALS CH
1.0000 | ORAL_TABLET | Freq: Every day | ORAL | Status: DC
  Administered 2020-01-16 – 2020-01-18 (×3): 1
  Filled 2020-01-16 (×4): qty 1

## 2020-01-16 MED ORDER — SODIUM BICARBONATE 8.4 % IV SOLN: 100.0000 meq | Freq: Once | INTRAVENOUS | Status: AC

## 2020-01-16 MED ORDER — DEXTROSE 50 % IV SOLN
INTRAVENOUS | Status: AC
  Administered 2020-01-16: 12.5 g via INTRAVENOUS
  Filled 2020-01-16: qty 50

## 2020-01-16 MED ORDER — POTASSIUM CHLORIDE 10 MEQ/100ML IV SOLN
10.0000 meq | INTRAVENOUS | Status: AC
  Administered 2020-01-16 – 2020-01-17 (×6): 10 meq via INTRAVENOUS
  Filled 2020-01-16 (×6): qty 100

## 2020-01-16 MED ORDER — ALBUMIN HUMAN 5 % IV SOLN: 25.0000 g | Freq: Once | INTRAVENOUS | Status: DC

## 2020-01-16 MED ORDER — ORAL CARE MOUTH RINSE
15.0000 mL | OROMUCOSAL | Status: DC
  Administered 2020-01-17 – 2020-01-19 (×25): 15 mL via OROMUCOSAL

## 2020-01-16 MED ORDER — CALCIUM GLUCONATE-NACL 2-0.675 GM/100ML-% IV SOLN
2.0000 g | INTRAVENOUS | Status: AC
  Administered 2020-01-16: 2000 mg via INTRAVENOUS
  Filled 2020-01-16: qty 100

## 2020-01-16 MED ORDER — FENTANYL CITRATE (PF) 100 MCG/2ML IJ SOLN
12.5000 ug | INTRAMUSCULAR | Status: DC | PRN
  Administered 2020-01-16 – 2020-01-18 (×7): 25 ug via INTRAVENOUS
  Filled 2020-01-16 (×5): qty 2

## 2020-01-16 MED ORDER — DEXTROSE 10 % IV SOLN: INTRAVENOUS | Status: AC

## 2020-01-16 MED ORDER — PIPERACILLIN-TAZOBACTAM 3.375 G IVPB
3.3750 g | Freq: Three times a day (TID) | INTRAVENOUS | Status: DC
  Administered 2020-01-16 – 2020-01-19 (×9): 3.375 g via INTRAVENOUS
  Filled 2020-01-16 (×11): qty 50

## 2020-01-16 MED ORDER — ETOMIDATE 2 MG/ML IV SOLN
20.0000 mg | Freq: Once | INTRAVENOUS | Status: AC
  Administered 2020-01-16: 20 mg via INTRAVENOUS

## 2020-01-16 MED ORDER — HYDROCORTISONE NA SUCCINATE PF 100 MG IJ SOLR
50.0000 mg | Freq: Four times a day (QID) | INTRAMUSCULAR | Status: DC
  Administered 2020-01-16 – 2020-01-19 (×13): 50 mg via INTRAVENOUS
  Filled 2020-01-16 (×13): qty 2

## 2020-01-16 NOTE — Progress Notes (Signed)
eLink Physician-Brief Progress Note Patient Name: Rebecca Goodman DOB: [birth year] MRN: [id number]   Date of Service  01/16/2020  HPI/Events of Note  Patient with a MAP of 59. Serum Albumin 1.6.  eICU Interventions  Albumin 5 % 500 ml iv over 1 hour.        Thomasene Lot [name] 01/16/2020, 1:44 AM

## 2020-01-16 NOTE — Progress Notes (Signed)
CRITICAL VALUE ALERT  Critical Value:  hgb 6.0  Date & Time Notied:  01/16/20 @ 1706  Provider Notified: Dr. Merrily Pew  Orders Received/Actions taken: 2 U PRBCs & protonix infusion

## 2020-01-16 NOTE — Consult Note (Signed)
[facility] Consult: 8:15 AM 01/16/2020  LOS: 1 day    Referring Provider: Dr Harrell Gave  Primary Care Physician:  Glade Lloyd Camelia Eng, PA-C Primary Gastroenterologist:  unassigned    Reason for Consultation:  Acute decompensated liver disease   HPI: Rebecca Goodman is a 44 y.o. female.  PMH listed below.  ETOH abuse.   Macrocytic anemia Hgb ~ 9, MCV 110  in 03/2018.  Hospitalized with digital symmetric polyneuropathy C/W Guillain-Barr and diagnosed with macrocytic, folate deficiency anemia in December 2020.  Arrived in the ED last evening with several days of progressive altered mental status.  She was found down on the floor.  Apparently she had recently tried to decrease her alcohol consumption but her average daily alcohol intake not known.  Current BPs as low as 86/62, HR to 1teens.  sats low to 76% on 4 L [place] oxygen.   Glucose <20. T bili 13.2  >> 12.8.  Alk phos 202  >> 147.  AST/ALT 480/100  >> 841/107.  Lipase 22.  ammonia 123.  Sodium 133. In January 2020, LFTs normal. BUN/creat 7/1.2, rising.   INR 5.1 >> 8. APAP <10.  Urine tox screen negative, alcohol level not elevated Hgb 9 >> 7.2.  MCV 127.  Platelets 50 9K.  WBCs 8.8.  CTAP w contrast: Hepatomegaly, hepatic steatosis.  High density fluid in the colon, question representing p.o. contrast versus blood products.  Bilateral airspace disease. CT head with stable partially empty sella.  New finding of remote appearing right orbital floor fracture  On Levophed and just intubated placed on vent for airway protection.  Central line just placed.  Warming blanket for hypothermia.   Brown liquid stool in flexiseal.  100 cc of urine recorded thus far.   4 U FFP ordered.      Past Medical History:  Diagnosis Date  . Arthralgia of hip   .  Arthralgia of knee   . Folate deficiency 04/05/2018  . Guillain Barr syndrome ([place])   . Macrocytic anemia 04/05/2018  . Obesity     Past Surgical History:  Procedure Laterality Date  . WISDOM TOOTH EXTRACTION      Prior to Admission medications   Medication Sig Start Date End Date Taking? Authorizing Provider  folic acid (FOLVITE) 1 MG tablet Take 1 tablet (1 mg total) by mouth daily. 04/08/18   Samuella Cota, MD  gabapentin (NEURONTIN) 300 MG capsule Take 2 capsules (600 mg total) by mouth 3 (three) times daily. 04/25/18   Elsie Stain, MD  ibuprofen (ADVIL,MOTRIN) 800 MG tablet Take 1 tablet (800 mg total) by mouth every 8 (eight) hours as needed. 04/25/18   Elsie Stain, MD  Lidocaine-Aloe Vera (ALOE VERA/LIDOCAINE EX) Apply 1 application topically daily as needed (hand pain).    [provider]  oxyCODONE (OXY IR/ROXICODONE) 5 MG immediate release tablet Take 1-2 tablets (5-10 mg total) by mouth every 6 (six) hours as needed (take 1 tab for moderate pain, take 2 tabs for severe pain). 04/07/18   Samuella Cota, MD  Scheduled Meds: . Chlorhexidine Gluconate Cloth  6 each Topical Daily  . lactulose  30 g Per Tube Q4H  . LORazepam  0-4 mg Intravenous Q6H   Or  . LORazepam  0-4 mg Oral Q6H  . [START ON 01/18/2020] LORazepam  0-4 mg Intravenous Q12H   Or  . [START ON 01/18/2020] LORazepam  0-4 mg Oral Q12H  . pantoprazole (PROTONIX) IV  40 mg Intravenous QHS  . thiamine  100 mg Oral Daily   Or  . thiamine  100 mg Intravenous Daily   Infusions: . cefTRIAXone (ROCEPHIN)  IV    . dextrose 75 mL/hr at 01/16/20 0600  . vancomycin     PRN Meds: LORazepam, ondansetron (ZOFRAN) IV   Allergies as of 01/29/2020 - Review Complete 04/25/2018  Allergen Reaction Noted  . Prednisone Other (See Comments) 09/06/2013    Family History  Problem Relation Age of Onset  . Hypertension Father   . Stroke Father 34  . Diabetes Mother        borderline  . Diabetes  Maternal Aunt   . Diabetes Maternal Grandmother   . Diabetes Maternal Aunt   . Sudden death Neg Hx   . Heart attack Neg Hx   . Hyperlipidemia Neg Hx   . Cancer Neg Hx   . Heart disease Neg Hx     Social History   Socioeconomic History  . Marital status: Single    Spouse name: Not on file  . Number of children: Not on file  . Years of education: Not on file  . Highest education level: Not on file  Occupational History  . Not on file  Tobacco Use  . Smoking status: Current Every Day Smoker    Packs/day: 0.50    Types: Cigarettes  . Smokeless tobacco: Never Used  Substance and Sexual Activity  . Alcohol use: Yes    Alcohol/week: 2.0 standard drinks    Types: 1 Glasses of wine, 1 Shots of liquor per week  . Drug use: No  . Sexual activity: Never    Birth control/protection: None  Other Topics Concern  . Not on file  Social History Narrative   Daughter lives with her.   Works as Retail buyer.   Exercise - walking, stretching, aerobics.     Social Determinants of Health   Financial Resource Strain:   . Difficulty of Paying Living Expenses: Not on file  Food Insecurity:   . Worried About Charity fundraiser in the Last Year: Not on file  . Ran Out of Food in the Last Year: Not on file  Transportation Needs:   . Lack of Transportation (Medical): Not on file  . Lack of Transportation (Non-Medical): Not on file  Physical Activity:   . Days of Exercise per Week: Not on file  . Minutes of Exercise per Session: Not on file  Stress:   . Feeling of Stress : Not on file  Social Connections:   . Frequency of Communication with Friends and Family: Not on file  . Frequency of Social Gatherings with Friends and Family: Not on file  . Attends Religious Services: Not on file  . Active Member of Clubs or Organizations: Not on file  . Attends Archivist Meetings: Not on file  . Marital Status: Not on file  Intimate Partner Violence:   . Fear of Current  or Ex-Partner: Not on file  . Emotionally Abused: Not on file  . Physically Abused: Not on  file  . Sexually Abused: Not on file    REVIEW OF SYSTEMS: See HPI.  Unable to obtain history or review of systems from the now intubated patient.   PHYSICAL EXAM: Vital signs in last 24 hours: Vitals:   01/16/20 0700 01/16/20 0715  BP:  (!) 89/62  Pulse: (!) 105 (!) 109  Resp: (!) 30 (!) 22  Temp: (!) 97.2 F (36.2 C) (!) 97.3 F (36.3 C)  SpO2: (!) 76% (!) 86%   Wt Readings from Last 3 Encounters:  01/16/20 87.4 kg  04/25/18 93.4 kg  04/03/18 95.6 kg    General: Ill-appearing, jaundiced, intubated Head: No signs of head trauma.  No facial asymmetry Eyes: Scleral icterus no conjunctival pallor Ears: Unable to assess hearing  Nose: No congestion or discharge. Mouth: ETT in place.  No blood Neck: No JVD, no masses, no thyromegaly Lungs: Clear bilaterally Heart: Vigorous heart beat, prominent nondisplaced PMI.  Tachycardic, regular.  No MRG Abdomen: Slightly obese, soft.  Firm liver palpable to at least the umbilicus on the right.   Rectal: Brown, liquid stool in the Flexi-Seal Musc/Skeltl: No gross joint deformities.  No joint erythema or swelling Extremities: Minor, nonpitting pedal edema Neurologic: Unresponsive to exam.  No vigorous maneuvers performed to wake her up Skin: Several small purpura on the arms and across the upper chest.  No telangiectasia   Psych: Unable to assess  Intake/Output from previous day: 10/12 0701 - 10/13 0700 In: 1174.2 [I.V.:123.7; NG/GT:150; IV Piggyback:900.5] Out: 100 [Urine:100] Intake/Output this shift: No intake/output data recorded.  LAB RESULTS: Recent Labs    01/20/2020 1909 01/09/2020 2250 01/16/20 0340  WBC 9.2  --  8.8  HGB 9.0* 7.8* 7.2*  HCT 28.8* 23.0* 22.9*  PLT 59*  --  68*   BMET Lab Results  Component Value Date   NA 133 (L) 01/16/2020   NA 130 (L) 02/01/2020   NA 131 (L) 01/18/2020   K 3.7 01/16/2020   K 3.1 (L)  01/28/2020   K 3.4 (L) 01/18/2020   CL 90 (L) 01/16/2020   CL 86 (L) 01/31/2020   CL 104 04/25/2018   CO2 9 (L) 01/16/2020   CO2 9 (L) 01/22/2020   CO2 20 04/25/2018   GLUCOSE 60 (L) 01/16/2020   GLUCOSE <20 (LL) 01/08/2020   GLUCOSE 88 04/25/2018   BUN 7 01/16/2020   BUN <5 (L) 01/31/2020   BUN 7 04/25/2018   CREATININE 1.22 (H) 01/16/2020   CREATININE 1.10 (H) 02/01/2020   CREATININE 0.64 04/25/2018   CALCIUM 8.4 (L) 01/16/2020   CALCIUM 8.7 (L) 02/02/2020   CALCIUM 9.8 04/25/2018   LFT Recent Labs    01/20/2020 1909 01/16/20 0340  PROT 6.6 6.1*  ALBUMIN 1.6* 2.0*  AST 480* 841*  ALT 100* 107*  ALKPHOS 202* 147*  BILITOT 13.2* 12.8*  BILIDIR 7.0* 7.1*  IBILI 6.2* 5.7*   PT/INR Lab Results  Component Value Date   INR 8.0 (HH) 01/16/2020   INR 5.1 (HH) 01/17/2020   Hepatitis Panel No results for input(s): HEPBSAG, HCVAB, HEPAIGM, HEPBIGM in the last 72 hours. Lipase     Component Value Date/Time   LIPASE 22 02/03/2020 2221    Drugs of Abuse     Component Value Date/Time   LABOPIA NONE DETECTED 01/04/2020 2051   COCAINSCRNUR NONE DETECTED 01/29/2020 2051   LABBENZ NONE DETECTED 01/31/2020 2051   AMPHETMU NONE DETECTED 01/13/2020 2051   THCU NONE DETECTED 01/21/2020 2051   LABBARB NONE DETECTED  01/13/2020 2051     RADIOLOGY STUDIES: DG Chest 1 View  Result Date: 01/08/2020 CLINICAL DATA:  Altered level of consciousness EXAM: CHEST  1 VIEW COMPARISON:  None. FINDINGS: Cardiac silhouette is accentuated by low lung volumes and portable technique. No visible pleural effusions or pneumothorax. No acute osseous abnormality. IMPRESSION: No evidence of acute cardiopulmonary disease, although evaluation of the lung bases is limited by portable semi erect technique. Electronically Signed   By: Feliberto Harts MD   On: 01/20/2020 20:06   CT HEAD WO CONTRAST  Result Date: 02/03/2020 CLINICAL DATA:  Mental status change, encephalopathy EXAM: CT HEAD WITHOUT  CONTRAST TECHNIQUE: Contiguous axial images were obtained from the base of the skull through the vertex without intravenous contrast. COMPARISON:  CT head 04/02/2018 MRI 04/03/2018 FINDINGS: Brain: No evidence of acute infarction, hemorrhage, hydrocephalus, extra-axial collection, visible mass lesion or mass effect. Basal cisterns are patent. Slight CSF expansion of the sella, similar to prior. Remaining midline intracranial structures are unremarkable. Cerebellar tonsils normally positioned. Vascular: No hyperdense vessel or unexpected calcification. Skull: No calvarial fracture or suspicious osseous lesion. No scalp swelling or hematoma. Remote appearing right orbital floor fracture though new since comparisons in 2019. Sinuses/Orbits: Minimal thickening in the ethmoid sinuses. Remaining paranasal sinuses and mastoid air cells are predominantly clear. Right orbital floor fracture, as above. Included orbital contents are otherwise unremarkable. Other: None. IMPRESSION: 1. No acute intracranial abnormality. 2. Partially empty sella, similar to comparison. 3. Remote appearing right orbital floor fracture appears remote though is a new finding since comparisons in 2019. Electronically Signed   By: Kreg Shropshire M.D.   On: 01/10/2020 23:27   CT ABDOMEN PELVIS W CONTRAST  Result Date: 01/28/2020 CLINICAL DATA:  Abdominal pain, acute, nonlocalized. Alcohol use, Guillain Barre. Icterus. EXAM: CT ABDOMEN AND PELVIS WITH CONTRAST TECHNIQUE: Multidetector CT imaging of the abdomen and pelvis was performed using the standard protocol following bolus administration of intravenous contrast. CONTRAST:  OMNIPAQUE IOHEXOL 300 MG/ML  SOLN COMPARISON:  None. FINDINGS: Lower chest: Peribronchovascular consolidative airspace opacities within the lingula as well as left lower lobe. Patchy ground-glass airspace opacities within the right lower lobe. Consolidative patchy airspace opacities within the right upper lobe and right  middle lobe. Hepatobiliary: The liver is enlarged measuring up to 25.5 cm. Markedly diffuse Lea hypodense hepatic parenchyma compared to the spleen consistent with hepatic steatosis. No focal hepatic lesions; however, limited evaluation on this single venous contrast study. Prominent gallbladder wall which can be seen in chronic liver disease. No radiopaque gallstones identified. No biliary ductal dilatation. Pancreas: No focal lesion. Normal pancreatic contour. No surrounding inflammatory changes. No main pancreatic ductal dilatation. Spleen: Normal in size without focal abnormality. Adrenals/Urinary Tract: No adrenal nodule bilaterally. Bilateral kidneys enhance symmetrically. No hydronephrosis. No hydroureter. The urinary bladder is unremarkable. Stomach/Bowel: Stomach is within normal limits. The appendix not definitely identified. No evidence of bowel wall thickening, distention, or inflammatory changes. Fluid noted within the lumen of the colon; the fluid is noted to measure 60-80 Hounsfield units in density. Vascular/Lymphatic: No abdominal aorta or iliac aneurysm. No abdominal, pelvic, or inguinal lymphadenopathy. the portal, splenic, superior mesenteric veins are patent. Reproductive: Suggestion of isodense pericentimeter soft tissue lesions within the right uterine wall suggestive of uterine fibroids no adnexal lesions. Other: No intraperitoneal free fluid. No intraperitoneal free gas. No organized fluid collection. Musculoskeletal: Mild subcutaneus soft tissue edema. No suspicious lytic or blastic osseous lesions. No acute displaced fracture. Multilevel degenerative changes of the spine.  IMPRESSION: 1. Pulmonary findings suggestive of infection or inflammation (such as aspiration pneumonia). COVID-19 not excluded. Recommend short-term follow-up CT post treatment to exclude underlying lesion/malignancy. 2. Hepatomegaly and hepatic steatosis. 3. Fast state of transition/diarrhea with high density fluid  within the lumen of the colon which may be due to PO contrast administration versus blood products. Correlate with physical exam and history. Electronically Signed   By: Iven Finn M.D.   On: 01/24/2020 23:55   DG Abd Portable 1V  Result Date: 01/16/2020 CLINICAL DATA:  Nasogastric tube placement EXAM: PORTABLE ABDOMEN - 1 VIEW COMPARISON:  None. FINDINGS: Tip and side port of the nasogastric tube project over the stomach. Nonobstructive bowel gas pattern. IMPRESSION: Nasogastric tube tip and side port in the stomach. Electronically Signed   By: Ulyses Jarred M.D.   On: 01/16/2020 01:18   EEG adult  Result Date: 01/16/2020 Lora Havens, MD     01/16/2020  8:14 AM Patient Name: Aliannah Holstrom MRN: [id number] Epilepsy Attending: Lora Havens Referring Physician/Provider: Dr. Rhys Martini Date: 01/16/2020 Duration: Patient history: 44 year old female with history of extensive alcohol abuse was presented with altered mental status for last several days.  EEG to evaluate for seizures. Level of alertness: Awake/ lethargic AEDs during EEG study: Ativan Technical aspects: This EEG study was done with scalp electrodes positioned according to the 10-20 International system of electrode placement. Electrical activity was acquired at a sampling rate of $Remov'500Hz'BXticG$  and reviewed with a high frequency filter of $RemoveB'70Hz'CUfNxpaq$  and a low frequency filter of $RemoveB'1Hz'REueyoxy$ . EEG data were recorded continuously and digitally stored. Description: No posterior dominant rhythm was seen.  EEG showed continuous generalized 3 to 6 Hz theta-delta slowing.  Hyperventilation and photic stimulation were not performed.   Of note, parts of EEG were difficult interpret due to significant electrode artifact. ABNORMALITY -Continuous slow, generalized IMPRESSION: This technically difficult study suggestive of moderate diffuse encephalopathy, nonspecific to etiology. No seizures or epileptiform discharges were seen throughout the recording. Priyanka  Barbra Sarks     IMPRESSION:   *    Acute, decompensated liver disease in patient who abuses alcohol. Level of transaminase elevation suggests other contributing etiologies such as ischemia, ? underlying autoimmune or metabolic liver disease. Discriminant function score of 247. Received 25 g on Albumin 5% solution x 1  *   AMS, HE.  Chronulac, Thiamine in place.   EEG completed per Neuro "difficult study suggestive of moderate diffuse encephalopathy, nonspecific to etiology".    *   Coagulopathy.    *   Thrombocytopenia.    *    Hypoglycemia.  Improved.    *    Macrocytic anemia.   Hx folate deficiency  Dx 03/2018 Folate IV x 1.    *   bil PNA.  On Vanc, Cefepime.    *    etoh abuse.   Ativan in place.     PLAN:     *   Supportive care.   Check acute hepatitis serologies, autoimmune and metabollic liver dz labs.   ? Add Prednisolone, but w active resp disease will d/w CCM and Dr Hilarie Fredrickson.     Azucena Freed  01/16/2020, 8:15 AM Phone [phone]

## 2020-01-16 NOTE — Progress Notes (Signed)
Hypoglycemic Event  CBG: 61  Treatment: D50 25 mL (12.5 gm)  Symptoms: None  Follow-up CBG: Time:0412 CBG Result: 145  Possible Reasons for Event: Unknown  Comments/MD notified: ELINK MD and bedside RN made aware. New orders for D10    Tennelle Taflinger, Pearla Dubonnet

## 2020-01-16 NOTE — Progress Notes (Signed)
Stat  EEG complete - results pending.  

## 2020-01-16 NOTE — Progress Notes (Signed)
eLink Physician-Brief Progress Note Patient Name: Rebecca Goodman DOB: 10/26/1975 MRN: [id number]   Date of Service  01/16/2020  HPI/Events of Note  Patient has perineal and peri-vaginal wounds that would be adversely impacted by urinary soilage.  eICU Interventions  Foley catheter ordered.        Thomasene Lot Sosha Shepherd 01/16/2020, 12:19 AM

## 2020-01-16 NOTE — Procedures (Signed)
Arterial Catheter Insertion Procedure Note  Rebecca Goodman  [id number]  11-29-1975  Date:01/16/20  Time:1:12 PM    Provider Performing: Shelby Mattocks    Procedure: Insertion of Arterial Line (27078) with US guidance (67544)   Indication(s) Blood pressure monitoring and/or need for frequent ABGs  Consent Unable to obtain consent due to emergent nature of procedure.  Anesthesia None   Time Out Verified patient identification, verified procedure, site/side was marked, verified correct patient position, special equipment/implants available, medications/allergies/relevant history reviewed, required imaging and test results available.   Sterile Technique Maximal sterile technique including full sterile barrier drape, hand hygiene, sterile gown, sterile gloves, mask, hair covering, sterile ultrasound probe cover (if used).   Procedure Description Area of catheter insertion was cleaned with chlorhexidine and draped in sterile fashion. With real-time ultrasound guidance an arterial catheter was placed into the left radial artery.  Appropriate arterial tracings confirmed on monitor.     Complications/Tolerance None; patient tolerated the procedure well.   EBL Minimal   Specimen(s) None Rebecca Goodman ACNP-BC [facility] Pulmonary/Critical Care Pager # [phone] OR # [phone] if no answer

## 2020-01-16 NOTE — Progress Notes (Signed)
eLink Physician-Brief Progress Note Patient Name: Rebecca Goodman DOB: January 14, 1976 MRN: [id number]   Date of Service  01/16/2020  HPI/Events of Note  Hypoglycemia trending.  eICU Interventions  D 10 % W ordered to run at 75 ml / hour.        Thomasene Lot Rebecca Goodman 01/16/2020, 4:05 AM

## 2020-01-16 NOTE — Procedures (Addendum)
Patient Name: Rebecca Goodman  MRN: [id number]  Epilepsy Attending: Charlsie Quest  Referring Physician/Provider: Dr. Glyn Ade Date: 01/16/2020 Duration: 25.29 mins  Patient history: 44 year old female with history of extensive alcohol abuse was presented with altered mental status for last several days.  EEG to evaluate for seizures.  Level of alertness: Awake/ lethargic, asleep  AEDs during EEG study: Ativan  Technical aspects: This EEG study was done with scalp electrodes positioned according to the 10-20 International system of electrode placement. Electrical activity was acquired at a sampling rate of 500Hz  and reviewed with a high frequency filter of 70Hz  and a low frequency filter of 1Hz . EEG data were recorded continuously and digitally stored.   Description: No posterior dominant rhythm was seen.  Sleep was characterized by sleep spindles (13 to 15 Hz), maximal frontocentral region.  EEG showed continuous generalized 3 to 6 Hz theta-delta slowing.  Hyperventilation and photic stimulation were not performed.     Of note, parts of EEG were difficult interpret due to significant electrode artifact.  ABNORMALITY -Continuous slow, generalized  IMPRESSION: This technically difficult study suggestive of moderate diffuse encephalopathy, nonspecific to etiology. No seizures or epileptiform discharges were seen throughout the recording.  Rebecca Goodman 

## 2020-01-16 NOTE — Progress Notes (Signed)
eLink Physician-Brief Progress Note Patient Name: Rebecca Goodman DOB: Apr 13, 1975 MRN: [id number]   Date of Service  01/16/2020  HPI/Events of Note  Patient with sub-optimal sedation on the ventilator.  eICU Interventions  Modified Precedex dose started, along with a modified  (lower) prn Fentanyl dose regimen.        Thomasene Lot Molina Hollenback 01/16/2020, 8:25 PM

## 2020-01-16 NOTE — Procedures (Signed)
Central Venous Catheter Insertion Procedure Note  Rebecca Goodman  [id number]  1976/01/02  Date:01/16/20  Time:3:49 PM   Provider Performing:Sheilla Maris   Procedure: Insertion of Non-tunneled Central Venous [phone]) with US guidance (03754)   Indication(s) Medication administration  Consent Risks of the procedure as well as the alternatives and risks of each were explained to the patient and/or caregiver.  Consent for the procedure was obtained and is signed in the bedside chart  Anesthesia Topical only with 1% lidocaine   Timeout Verified patient identification, verified procedure, site/side was marked, verified correct patient position, special equipment/implants available, medications/allergies/relevant history reviewed, required imaging and test results available.  Sterile Technique Maximal sterile technique including full sterile barrier drape, hand hygiene, sterile gown, sterile gloves, mask, hair covering, sterile ultrasound probe cover (if used).  Procedure Description Area of catheter insertion was cleaned with chlorhexidine and draped in sterile fashion.  With real-time ultrasound guidance a central venous catheter was placed into the left internal jugular vein. Nonpulsatile blood flow and easy flushing noted in all ports.  The catheter was sutured in place and sterile dressing applied.  Complications/Tolerance None; patient tolerated the procedure well. Chest X-ray is ordered to verify placement for internal jugular or subclavian cannulation.   Chest x-ray is not ordered for femoral cannulation.  EBL Minimal  Specimen(s) None

## 2020-01-16 NOTE — Progress Notes (Signed)
CRITICAL VALUE ALERT  Critical Value: k 2.4  Date & Time Notied:  01/16/2020 @ 1725  Provider Notified: Dr. Merrily Pew  Orders Received/Actions taken: 2 runs of K IV

## 2020-01-16 NOTE — Progress Notes (Signed)
NAME:  Rebecca Goodman, MRN:  [id number], DOB:  12/28/1975, LOS: 1 ADMISSION DATE:  01/11/2020, CONSULTATION DATE: 10/12 REFERRING MD: Dr. Anitra LauthPlunkett EDP, CHIEF COMPLAINT: Altered mental status  Brief History   44 year old female with history of alcohol abuse presents with altered mental status times days.  In ED found to have hepatic failure with lactic acid greater than 11 and was lethargic.  PCCM asked to admit.  History of present illness   Patient is encephalopathic and/or intubated. Therefore history has been obtained from chart review. 44 year old female with past medical history as below, which is significant for DM Barr syndrome and alcohol abuse.  Per family reports she typically drinks quite heavily, but had been attempting to cut down on drinking and was down to 1-2 drinks per day.  EMS was dispatched on 10/12 in the late evening hours due to altered mental status times several days.  It got to the point she was quite delirious and was unable to recognize family members.  She had been found on the floor beside the bed more than once in the preceding 24 hours.  Upon arrival to the emergency department she was quite lethargic and and there was questionable seizure activity.  CBG reading was "low".  She was hypothermic with temperature 90.4 F.  Laboratory evaluation was significant for lactic acid greater than 11, ammonia 123, AST 480, ALT 100, total bilirubin 13.2, INR 5.1, and alcohol level undetectable.  Hypothermia raise concern for sepsis and she was started on empiric antibiotics.  CT imaging was unable to be done initially due to patient's ability to cooperate.  Gastroenterology was consulted for liver failure and PCCM was asked to admit.  Past Medical History   has a past medical history of Arthralgia of hip, Arthralgia of knee, Folate deficiency (04/05/2018), Guillain Barr syndrome (HCC), Macrocytic anemia (04/05/2018), and Obesity.   Significant Hospital Events   10/12 admit 10/13 >  intubated  Consults:  Gastroenterology  Procedures:  10/13 >> intubated 10/13 >> central line placed  Significant Diagnostic Tests:  CT head 10/12 > stable, partially empty sella. New finding of remote appearing R orbital floor fracture  CT abdomen pelvis 10/12 > Hepatomegaly, hepatic steatosis. High density fluid in the colon, question representing p.o. contrast vs blood products.  Bilateral airspace disease.   Micro Data:  Blood 10/12> Urine 10/12>  Antimicrobials:  Cefepime 10/12 > 10/12 Ceftriaxone 10/12 > 10/12 Vancomycin 10/12 > Zosyn 10/13 >  Interim history/subjective:  Foley inserted to prevent urinary soilage in setting of perineal and perivaginal bleeding.  Given albumin 5% to counter intravascular depletion.  D10 infusion given for hypoglycemia.   EEG showing moderate diffuse encephalopathy, but no active seizures.  Intubated this AM due to acute respiratory failure 2/2 aspiration pneumonia. Central line also placed this AM.  Contacted patient's cousin, Clevette, who is listed as her only contact at this time. Clevette mentioned that patient has a daughter, but that she is "too young and scared to make these types of decisions". Informed her about need for emergent intubation and central line placement. Will need to further establish goals of care and primary decision maker (likely will be daughter).   Objective   Blood pressure (!) 89/62, pulse (!) 106, temperature (!) 96.1 F (35.6 C), temperature source Bladder, resp. rate (!) 25, height 5\' 3"  (1.6 m), weight 87.4 kg, SpO2 99 %.    Vent Mode: PRVC FiO2 (%):  [100 %] 100 % Set Rate:  [25 bmp] 25 bmp Vt  Set:  [410 mL] 410 mL PEEP:  [5 cmH20] 5 cmH20 Plateau Pressure:  [21 cmH20] 21 cmH20   Intake/Output Summary (Last 24 hours) at 01/16/2020 1110 Last data filed at 01/16/2020 1048 Gross per 24 hour  Intake 1503.88 ml  Output 100 ml  Net 1403.88 ml   Filed Weights   01/16/20 0500  Weight: 87.4 kg      Examination: General: critically ill appearing middle-aged female, on mechanical ventilation, in NAD. Eyes: scleral icterus present bilaterally CV: tachycardic with regular rhythm, S1S2 heard, no m/r/g Lungs: bilaterally mechanically ventilated breath sounds Abdomen: soft, moderately distended. +BS Extremities: 1+ nonpitting edema in bilateral LE Neuro: comfortably sedated GU: macerated skin in perineal area and bilateral inguinal areas.   Resolved Hospital Problem list     Assessment & Plan:  Decompensated hepatic failure Hepatic vs metabolic encephalopathy Coagulopathy Thrombocytopenia Macrocytic anemia, hx of folate deficiency Likely Alcoholic Hepatitis Hypoglycemia Hx of alcohol abuse, not definitively known to have cirrhosis. CT abd/pel showing fatty liver disease and hepatomegaly. Coagulopathy worsening, with INR 8.0 today. Unifying diagnosis would likely be decompensated liver failure 2/2 chronic alcohol abuse, although alcohol level <10 on admission. Patient with very poor prognosis, will likely need liver transplant. - GI following, appreciate recs - FFP 4 units ordered - continue lactulose and thiamine - benzodiazepines prn for alcohol withdrawal - Vit K x 3 days - f/u hepatitis panel and serologies - DF score ~241, started on solucortef - EEG showing moderate diffuse encephalopathy, but no active seizures - trend hepatic panel - monitor CBGs - D5 infusion   Acute Respiratory Failure 2/2 aspiration PNA - continue vent management, wean as tolerated - VAP bundle - wean sedation as tolerated, goal RAAS 0 to -3 - lung protective ventilation - continue vanc/zosyn, monitor renal function   Septic Shock High Anion Gap Metabolic Acidosis with secondary metabolic alkalosis and compensatory respiratory alkalosis AGMA likely from lactic acidosis/ketosis in the context of possible alcohol abuse. Metabolic alkalosis likely from contraction alkalosis. Respiratory  alkalosis from respiratory compensation/tachypnea. - continue vanc/zosyn - lactic still >11, continue to monitor - given 2 amps of bicarb, will start bicarb infusion for metabolic acidosis - albumin infusion and D5 infusion for metabolic alkalosis - ventilator management for respiratory alkalosis, continue to keep patient tachypneic to mimic physiologic respiratory compensation - levophed drip and vasopressin drip for MAP >65, wean as tolerated - solucortef for possible relative adrenal insufficiency in the setting of septic shock - trend ABGs and BMP - blood cx and urine cx pending   At risk of alcohol withdrawal - CIWA protocol - Seizure precautions - Thiamine - Folic acid   Best practice:  Diet: NPO Pain/Anxiety/Delirium protocol (if indicated): NA VAP protocol (if indicated): Y DVT prophylaxis: NA GI prophylaxis: PPI Glucose control: CBG monitoring Mobility: Bedrest Code Status: Full Family Communication: Family updated via phone Disposition: ICU  Labs   CBC: Recent Labs  Lab 01/05/2020 1909 01/04/2020 2250 01/16/20 0340  WBC 9.2  --  8.8  NEUTROABS 7.3  --   --   HGB 9.0* 7.8* 7.2*  HCT 28.8* 23.0* 22.9*  MCV 127.4*  --  127.2*  PLT 59*  --  68*    Basic Metabolic Panel: Recent Labs  Lab 01/27/2020 1909 02/02/2020 2250 01/16/20 0340  NA 131* 130* 133*  K 3.4* 3.1* 3.7  CL 86*  --  90*  CO2 9*  --  9*  GLUCOSE <20*  --  60*  BUN <5*  --  7  CREATININE 1.10*  --  1.22*  CALCIUM 8.7*  --  8.4*  MG  --   --  2.3  PHOS  --   --  4.1   GFR: Estimated Creatinine Clearance: 61.7 mL/min (A) (by C-G formula based on SCr of 1.22 mg/dL (H)). Recent Labs  Lab 19-Jan-2020 1904 01/16/2020 1909 01/08/2020 2221 02/02/2020 2224 01/16/20 0103 01/16/20 0340  PROCALCITON  --   --  0.51  --   --  0.54  WBC  --  9.2  --   --   --  8.8  LATICACIDVEN >11.0*  --   --  >11.0* >11.0* >11.0*    Liver Function Tests: Recent Labs  Lab 01/14/2020 1909 01/16/20 0340  AST 480* 841*   ALT 100* 107*  ALKPHOS 202* 147*  BILITOT 13.2* 12.8*  PROT 6.6 6.1*  ALBUMIN 1.6* 2.0*   Recent Labs  Lab 01/08/2020 2221  LIPASE 22   Recent Labs  Lab January 19, 2020 1904  AMMONIA 123*    ABG    Component Value Date/Time   PHART 7.436 19-Jan-2020 2250   PCO2ART 16.0 (LL) 01/22/2020 2250   PO2ART 28 (LL) 02/03/2020 2250   HCO3 11.3 (L) 01/22/2020 2250   TCO2 12 (L) 01/24/2020 2250   ACIDBASEDEF 13.0 (H) 02/03/2020 2250   O2SAT 73.0 02/02/2020 2250     Coagulation Profile: Recent Labs  Lab Jan 19, 2020 1909 01/16/20 0340  INR 5.1* 8.0*    Cardiac Enzymes: No results for input(s): CKTOTAL, CKMB, CKMBINDEX, TROPONINI in the last 168 hours.  HbA1C: No results found for: HGBA1C  CBG: Recent Labs  Lab January 19, 2020 2351 01/16/20 0143 01/16/20 0347 01/16/20 0412 01/16/20 0509  GLUCAP 70 78 61* 145* 134*    Review of Systems:   Patient is encephalopathic and/or intubated. Therefore history has been obtained from chart review.  Patient answers all questions with "Okay"  Past Medical History  She,  has a past medical history of Arthralgia of hip, Arthralgia of knee, Folate deficiency (04/05/2018), Guillain Barr syndrome (HCC), Macrocytic anemia (04/05/2018), and Obesity.   Surgical History    Past Surgical History:  Procedure Laterality Date  . WISDOM TOOTH EXTRACTION       Social History   reports that she has been smoking cigarettes. She has been smoking about 0.50 packs per day. She has never used smokeless tobacco. She reports current alcohol use of about 2.0 standard drinks of alcohol per week. She reports that she does not use drugs.   Family History   Her family history includes Diabetes in her maternal aunt, maternal aunt, maternal grandmother, and mother; Hypertension in her father; Stroke (age of onset: 74) in her father. There is no history of Sudden death, Heart attack, Hyperlipidemia, Cancer, or Heart disease.   Allergies Allergies  Allergen Reactions  .  Prednisone Other (See Comments)    IM injection caused severe anxiety, felt like heart attack     Home Medications  Prior to Admission medications   Medication Sig Start Date End Date Taking? Authorizing Provider  folic acid (FOLVITE) 1 MG tablet Take 1 tablet (1 mg total) by mouth daily. 04/08/18   Standley Brooking, MD  gabapentin (NEURONTIN) 300 MG capsule Take 2 capsules (600 mg total) by mouth 3 (three) times daily. 04/25/18   Storm Frisk, MD  ibuprofen (ADVIL,MOTRIN) 800 MG tablet Take 1 tablet (800 mg total) by mouth every 8 (eight) hours as needed. 04/25/18   Storm Frisk, MD  Lidocaine-Aloe  Vera (ALOE VERA/LIDOCAINE EX) Apply 1 application topically daily as needed (hand pain).    [provider]  oxyCODONE (OXY IR/ROXICODONE) 5 MG immediate release tablet Take 1-2 tablets (5-10 mg total) by mouth every 6 (six) hours as needed (take 1 tab for moderate pain, take 2 tabs for severe pain). 04/07/18   Standley Brooking, MD     Critical care time: 25 minutes      Merrilyn Puma, MD 01/16/2020, 11:10 AM

## 2020-01-16 NOTE — Procedures (Signed)
Intubation Procedure Note  Kaytlynne Neace  [id number]  Feb 04, 1976  Date:01/16/20  Time:1015  Provider Performing:Jordana Dugue Alexander [place]   Procedure: Intubation (31500)  Indication(s) Respiratory Failure d/t aspiration pneumonia  Consent Unable to obtain consent due to emergent nature of procedure.   Anesthesia Etomidate and Rocuronium   Time Out Verified patient identification, verified procedure, site/side was marked, verified correct patient position, special equipment/implants available, medications/allergies/relevant history reviewed, required imaging and test results available.   Sterile Technique Usual hand hygeine, masks, and gloves were used   Procedure Description Patient positioned in bed supine.  Sedation given as noted above.  Patient was intubated with endotracheal tube using Glidescope.  View was Grade 1 full glottis .  Number of attempts was 1.  Colorimetric CO2 detector was consistent with tracheal placement.   Complications/Tolerance None; patient tolerated the procedure well. Chest X-ray is ordered to verify placement.   EBL Minimal   Specimen(s) None

## 2020-01-17 DIAGNOSIS — F1010 Alcohol abuse, uncomplicated: Secondary | ICD-10-CM

## 2020-01-17 LAB — BLOOD GAS, ARTERIAL
Acid-Base Excess: 2.4 mmol/L — ABNORMAL HIGH (ref 0.0–2.0)
Acid-Base Excess: 4.1 mmol/L — ABNORMAL HIGH (ref 0.0–2.0)
Bicarbonate: 25.4 mmol/L (ref 20.0–28.0)
Bicarbonate: 27.3 mmol/L (ref 20.0–28.0)
FIO2: 60
FIO2: 60
O2 Saturation: 96.4 %
O2 Saturation: 95.1 %
Patient temperature: 37
Patient temperature: 37
pCO2 arterial: 32.5 mmHg (ref 32.0–48.0)
pCO2 arterial: 35.3 mmHg (ref 32.0–48.0)
pH, Arterial: 7.505 — ABNORMAL HIGH (ref 7.350–7.450)
pH, Arterial: 7.5 — ABNORMAL HIGH (ref 7.350–7.450)
pO2, Arterial: 82.7 mmHg — ABNORMAL LOW (ref 83.0–108.0)
pO2, Arterial: 76 mmHg — ABNORMAL LOW (ref 83.0–108.0)

## 2020-01-17 LAB — CBC
HCT: 26.4 % — ABNORMAL LOW (ref 36.0–46.0)
HCT: 26.3 % — ABNORMAL LOW (ref 36.0–46.0)
Hemoglobin: 8.8 g/dL — ABNORMAL LOW (ref 12.0–15.0)
Hemoglobin: 8.6 g/dL — ABNORMAL LOW (ref 12.0–15.0)
MCH: 36.1 pg — ABNORMAL HIGH (ref 26.0–34.0)
MCH: 35.1 pg — ABNORMAL HIGH (ref 26.0–34.0)
MCHC: 33.3 g/dL (ref 30.0–36.0)
MCHC: 32.7 g/dL (ref 30.0–36.0)
MCV: 108.2 fL — ABNORMAL HIGH (ref 80.0–100.0)
MCV: 107.3 fL — ABNORMAL HIGH (ref 80.0–100.0)
Platelets: 76 10*3/uL — ABNORMAL LOW (ref 150–400)
Platelets: 60 10*3/uL — ABNORMAL LOW (ref 150–400)
RBC: 2.44 MIL/uL — ABNORMAL LOW (ref 3.87–5.11)
RBC: 2.45 MIL/uL — ABNORMAL LOW (ref 3.87–5.11)
RDW: 25.7 % — ABNORMAL HIGH (ref 11.5–15.5)
RDW: 25.3 % — ABNORMAL HIGH (ref 11.5–15.5)
WBC: 15 10*3/uL — ABNORMAL HIGH (ref 4.0–10.5)
WBC: 12.5 10*3/uL — ABNORMAL HIGH (ref 4.0–10.5)
nRBC: 0.9 % — ABNORMAL HIGH (ref 0.0–0.2)
nRBC: 1.5 % — ABNORMAL HIGH (ref 0.0–0.2)

## 2020-01-17 LAB — PREPARE FRESH FROZEN PLASMA
Unit division: 0
Unit division: 0

## 2020-01-17 LAB — GLUCOSE, CAPILLARY
Glucose-Capillary: 139 mg/dL — ABNORMAL HIGH (ref 70–99)
Glucose-Capillary: 143 mg/dL — ABNORMAL HIGH (ref 70–99)
Glucose-Capillary: 146 mg/dL — ABNORMAL HIGH (ref 70–99)
Glucose-Capillary: 146 mg/dL — ABNORMAL HIGH (ref 70–99)
Glucose-Capillary: 150 mg/dL — ABNORMAL HIGH (ref 70–99)
Glucose-Capillary: 152 mg/dL — ABNORMAL HIGH (ref 70–99)
Glucose-Capillary: <10 mg/dL — CL (ref 70–99)

## 2020-01-17 LAB — TYPE AND SCREEN
ABO/RH(D): O POS
Antibody Screen: NEGATIVE
Unit division: 0
Unit division: 0

## 2020-01-17 LAB — BPAM RBC
Blood Product Expiration Date: 202111172359
Blood Product Expiration Date: 202111182359
ISSUE DATE / TIME: 202110131953
ISSUE DATE / TIME: 202110132206
Unit Type and Rh: 5100
Unit Type and Rh: 5100

## 2020-01-17 LAB — COMPREHENSIVE METABOLIC PANEL
ALT: 187 U/L — ABNORMAL HIGH (ref 0–44)
AST: 2242 U/L — ABNORMAL HIGH (ref 15–41)
Albumin: 2.1 g/dL — ABNORMAL LOW (ref 3.5–5.0)
Alkaline Phosphatase: 170 U/L — ABNORMAL HIGH (ref 38–126)
Anion gap: 22 — ABNORMAL HIGH (ref 5–15)
BUN: 9 mg/dL (ref 6–20)
CO2: 23 mmol/L (ref 22–32)
Calcium: 8.6 mg/dL — ABNORMAL LOW (ref 8.9–10.3)
Chloride: 91 mmol/L — ABNORMAL LOW (ref 98–111)
Creatinine, Ser: 2.09 mg/dL — ABNORMAL HIGH (ref 0.44–1.00)
GFR, Estimated: 28 mL/min — ABNORMAL LOW (ref >60)
Glucose, Bld: 163 mg/dL — ABNORMAL HIGH (ref 70–99)
Potassium: 3.1 mmol/L — ABNORMAL LOW (ref 3.5–5.1)
Sodium: 136 mmol/L (ref 135–145)
Total Bilirubin: 18.7 mg/dL (ref 0.3–1.2)
Total Protein: 6.2 g/dL — ABNORMAL LOW (ref 6.5–8.1)

## 2020-01-17 LAB — BPAM FFP
Blood Product Expiration Date: 202110162359
Blood Product Expiration Date: 202110162359
Blood Product Expiration Date: 202110162359
Blood Product Expiration Date: 202110162359
ISSUE DATE / TIME: 202110131007
ISSUE DATE / TIME: 202110131103
ISSUE DATE / TIME: 202110131103
ISSUE DATE / TIME: 202110131103
Unit Type and Rh: 5100
Unit Type and Rh: 5100
Unit Type and Rh: 9500
Unit Type and Rh: 9500

## 2020-01-17 LAB — IGG: IgG (Immunoglobin G), Serum: 1492 mg/dL (ref 586–1602)

## 2020-01-17 LAB — PROCALCITONIN: Procalcitonin: 3.54 ng/mL

## 2020-01-17 LAB — LACTIC ACID, PLASMA: Lactic Acid, Venous: 7.8 mmol/L (ref 0.5–1.9)

## 2020-01-17 LAB — PHOSPHORUS: Phosphorus: 2.1 mg/dL — ABNORMAL LOW (ref 2.5–4.6)

## 2020-01-17 LAB — ANTINUCLEAR ANTIBODIES, IFA: ANA Ab, IFA: NEGATIVE

## 2020-01-17 LAB — ANTI-SMOOTH MUSCLE ANTIBODY, IGG: F-Actin IgG: 28 Units — ABNORMAL HIGH (ref 0–19)

## 2020-01-17 LAB — CERULOPLASMIN: Ceruloplasmin: 17.5 mg/dL — ABNORMAL LOW (ref 19.0–39.0)

## 2020-01-17 LAB — MAGNESIUM: Magnesium: 2.3 mg/dL (ref 1.7–2.4)

## 2020-01-17 LAB — CK: Total CK: 588 U/L — ABNORMAL HIGH (ref 38–234)

## 2020-01-17 LAB — TSH: TSH: 1.93 u[IU]/mL (ref 0.350–4.500)

## 2020-01-17 MED ORDER — FENTANYL 2500MCG IN NS 250ML (10MCG/ML) PREMIX INFUSION
0.0000 ug/h | INTRAVENOUS | Status: DC
  Administered 2020-01-17: 25 ug/h via INTRAVENOUS
  Administered 2020-01-19: 150 ug/h via INTRAVENOUS
  Filled 2020-01-17 (×3): qty 250

## 2020-01-17 MED ORDER — SODIUM CHLORIDE 0.9% FLUSH
10.0000 mL | Freq: Two times a day (BID) | INTRAVENOUS | Status: DC
  Administered 2020-01-17 – 2020-01-19 (×6): 10 mL

## 2020-01-17 MED ORDER — PANTOPRAZOLE SODIUM 40 MG IV SOLR
40.0000 mg | Freq: Two times a day (BID) | INTRAVENOUS | Status: DC
  Administered 2020-01-17 – 2020-01-19 (×5): 40 mg via INTRAVENOUS
  Filled 2020-01-17 (×6): qty 40

## 2020-01-17 MED ORDER — VITAL AF 1.2 CAL PO LIQD
1000.0000 mL | ORAL | Status: DC
  Administered 2020-01-17: 1000 mL

## 2020-01-17 MED ORDER — POTASSIUM CHLORIDE 20 MEQ/15ML (10%) PO SOLN
20.0000 meq | ORAL | Status: AC
  Administered 2020-01-17 (×2): 20 meq
  Filled 2020-01-17 (×2): qty 15

## 2020-01-17 MED ORDER — PROSOURCE TF PO LIQD: 45.0000 mL | Freq: Two times a day (BID) | ORAL | Status: DC

## 2020-01-17 MED ORDER — MIDAZOLAM HCL 2 MG/2ML IJ SOLN
0.5000 mg | INTRAMUSCULAR | Status: AC | PRN
  Administered 2020-01-18 (×2): 1 mg via INTRAVENOUS
  Filled 2020-01-17 (×2): qty 2

## 2020-01-17 MED ORDER — POTASSIUM CHLORIDE 10 MEQ/50ML IV SOLN
10.0000 meq | INTRAVENOUS | Status: AC
  Administered 2020-01-17 (×2): 10 meq via INTRAVENOUS
  Filled 2020-01-17 (×2): qty 50

## 2020-01-17 MED ORDER — MIDAZOLAM HCL 2 MG/2ML IJ SOLN
1.0000 mg | INTRAMUSCULAR | Status: DC | PRN
  Administered 2020-01-17: 1 mg via INTRAVENOUS
  Filled 2020-01-17: qty 2

## 2020-01-17 MED ORDER — LACTULOSE 10 GM/15ML PO SOLN
30.0000 g | Freq: Three times a day (TID) | ORAL | Status: DC
  Administered 2020-01-17 – 2020-01-18 (×4): 30 g
  Filled 2020-01-17 (×5): qty 45

## 2020-01-17 MED ORDER — CALCIUM GLUCONATE-NACL 2-0.675 GM/100ML-% IV SOLN
2.0000 g | INTRAVENOUS | Status: AC
  Administered 2020-01-17: 2000 mg via INTRAVENOUS
  Filled 2020-01-17: qty 100

## 2020-01-17 MED ORDER — ALBUMIN HUMAN 5 % IV SOLN
25.0000 g | Freq: Once | INTRAVENOUS | Status: AC
  Administered 2020-01-17: 25 g via INTRAVENOUS
  Filled 2020-01-17: qty 500

## 2020-01-17 MED ORDER — SODIUM CHLORIDE 0.9% FLUSH: 10.0000 mL | INTRAVENOUS | Status: DC | PRN

## 2020-01-17 NOTE — Consult Note (Signed)
[facility]  HISTORY AND PHYSICAL  Rebecca Goodman is an 44 y.o. female.    Chief Complaint: found down  HPI: pt is a 63F with EtOH abuse, h/o Guillain Barre syndrome, and DM who is now seen in consultation at the request of Dr. Tacy Learn for eval and recs re: AKI and oliguria.  History obtained from the chart as pt intubated and sedated.  Pt was admitted 10/12 after EMS was called d/t pt being unresponsive.  Had had several AMS episodes according to family in the preceding 24 hrs.  Her CBG was < 10 and she was hypothermic to 90.4.    In the ED, lactate > 11, ammonia 123, AST and ALT elevated.  INR 5.1, Tbili 13.2.  Started on lactulose, empiric antibiotics.  Was intubated emergently yesterday.  CT head 10/12 nothing acute but partially empty sella, CT abd/ pelvis with hepatic steatosis and hepatomegaly, possible infection/ inflammation in lungs.  GI consulted.  Has acute decompensated EtOH hepatitis, anemia (? GI bleed but nothing overt).  MELD score worsening and is 38 today.  Has gotten FFP and pRBCs to correct coagulopathy.  LFTs rising today, esp AST of > 2000, Tbili 18.2. Cr 1.10 on admission and is 2.09 today.  Hypokalemic, minimal UOP.  On stress dose steroids, Zosyn (vanc stopped), IV PPI BID.  In this setting we are asked to see.    ROS unobtainable d/t intubated/ sedated.  PMH: Past Medical History:  Diagnosis Date  . Arthralgia of hip   . Arthralgia of knee   . Folate deficiency 04/05/2018  . Guillain Barr syndrome ([place])   . Macrocytic anemia 04/05/2018  . Obesity    PSH: Past Surgical History:  Procedure Laterality Date  . WISDOM TOOTH EXTRACTION      Past Medical History:  Diagnosis Date  . Arthralgia of hip   . Arthralgia of knee   . Folate deficiency 04/05/2018  . Guillain Barr syndrome ([place])   . Macrocytic anemia 04/05/2018  . Obesity     Medications:   Scheduled: . sodium chloride   Intravenous Once  . chlorhexidine gluconate (MEDLINE KIT)  15 mL  Mouth Rinse BID  . Chlorhexidine Gluconate Cloth  6 each Topical Daily  . folic acid  1 mg Per Tube Daily  . hydrocortisone sod succinate (SOLU-CORTEF) inj  50 mg Intravenous Q6H  . lactulose  30 g Per Tube TID  . mouth rinse  15 mL Mouth Rinse 10 times per day  . multivitamin with minerals  1 tablet Per Tube Daily  . pantoprazole (PROTONIX) IV  40 mg Intravenous BID  . sodium chloride flush  10-40 mL Intracatheter Q12H  . thiamine  100 mg Per Tube Daily    Medications Prior to Admission  Medication Sig Dispense Refill  . folic acid (FOLVITE) 1 MG tablet Take 1 tablet (1 mg total) by mouth daily. 30 tablet 1  . gabapentin (NEURONTIN) 300 MG capsule Take 2 capsules (600 mg total) by mouth 3 (three) times daily. 40 capsule 0  . ibuprofen (ADVIL,MOTRIN) 800 MG tablet Take 1 tablet (800 mg total) by mouth every 8 (eight) hours as needed. 30 tablet 0  . Lidocaine-Aloe Vera (ALOE VERA/LIDOCAINE EX) Apply 1 application topically daily as needed (hand pain).    Marland Kitchen oxyCODONE (OXY IR/ROXICODONE) 5 MG immediate release tablet Take 1-2 tablets (5-10 mg total) by mouth every 6 (six) hours as needed (take 1 tab for moderate pain, take 2 tabs for severe pain). 40 tablet  0    ALLERGIES:   Allergies  Allergen Reactions  . Prednisone Other (See Comments)    IM injection caused severe anxiety, felt like heart attack    FAM HX: Family History  Problem Relation Age of Onset  . Hypertension Father   . Stroke Father 50  . Diabetes Mother        borderline  . Diabetes Maternal Aunt   . Diabetes Maternal Grandmother   . Diabetes Maternal Aunt   . Sudden death Neg Hx   . Heart attack Neg Hx   . Hyperlipidemia Neg Hx   . Cancer Neg Hx   . Heart disease Neg Hx     Social History:   reports that she has been smoking cigarettes. She has been smoking about 0.50 packs per day. She has never used smokeless tobacco. She reports current alcohol use of about 2.0 standard drinks of alcohol per week. She  reports that she does not use drugs.  ROS: ROS: unobtainable d/t intubated/ sedated  Blood pressure (!) 108/48, pulse 71, temperature (!) 100.4 F (38 C), resp. rate (!) 30, height _0  (1.6 m), weight 87.4 kg, SpO2 91 %. PHYSICAL EXAM: Physical Exam  GEN NAD, lying in bed, unresponsive HEENT + temporal wasting, + icteric sclera NECK no JVD PULM mech bilaterally CV tachycardic no mr/g/ ABD soft, distended, + liver edge felt 5 cm below costal margin EXT 2+ LE edema NEURO intubated and sedated SKIN ++ jaundiced, some petechiae present  Results for orders placed or performed during the hospital encounter of 01/23/2020 (from the past 48 hour(s))  Ammonia     Status: Abnormal   Collection Time: 01/13/2020  7:04 PM  Result Value Ref Range   Ammonia 123 (H) 9 - 35 umol/L    Comment: Performed at [facility], [place] [address] [place], [place] [address]  Lactic acid, plasma     Status: Abnormal   Collection Time: 01/10/2020  7:04 PM  Result Value Ref Range   Lactic Acid, Venous >11.0 (HH) 0.5 - 1.9 mmol/L    Comment: ICTERIC SPECIMEN CRITICAL RESULT CALLED TO, READ BACK BY AND VERIFIED WITH: D.SIDBERRY RN 2006 01/31/2020 MCCORMICK K Performed at [facility], [place] [address], [place], [place] [address]   Blood Cultures (routine x 2)     Status: None (Preliminary result)   Collection Time: 01/08/2020  7:04 PM   Specimen: BLOOD  Result Value Ref Range   Specimen Description BLOOD SITE NOT SPECIFIED    Special Requests      BOTTLES DRAWN AEROBIC AND ANAEROBIC Blood Culture adequate volume   Culture      NO GROWTH 2 DAYS Performed at [facility], [place] [address] [place], [place] [address]    Report Status PENDING   CBC WITH DIFFERENTIAL     Status: Abnormal   Collection Time: 01/30/2020  7:09 PM  Result Value Ref Range   WBC 9.2 4.0 - 10.5 K/uL   RBC 2.26 (L) 3.87 - 5.11 MIL/uL   Hemoglobin 9.0 (L) 12.0 - 15.0 g/dL   HCT 28.8 (L) 36 - 46 %   MCV 127.4 (H) 80.0 - 100.0  fL   MCH 39.8 (H) 26.0 - 34.0 pg   MCHC 31.3 30.0 - 36.0 g/dL   RDW 19.3 (H) 11.5 - 15.5 %   Platelets 59 (L) 150 - 400 K/uL    Comment: REPEATED TO VERIFY PLATELET COUNT CONFIRMED BY SMEAR Immature Platelet Fraction may  be clinically indicated, consider ordering this additional test [id number]    nRBC 0.7 (H) 0.0 - 0.2 %   Neutrophils Relative % 80 %   Neutro Abs 7.3 1.7 - 7.7 K/uL   Lymphocytes Relative 15 %   Lymphs Abs 1.4 0.7 - 4.0 K/uL   Monocytes Relative 4 %   Monocytes Absolute 0.3 0.1 - 1.0 K/uL   Eosinophils Relative 0 %   Eosinophils Absolute 0.0 0.0 - 0.5 K/uL   Basophils Relative 0 %   Basophils Absolute 0.0 0.0 - 0.1 K/uL   WBC Morphology VACUOLATED NEUTROPHILS    Immature Granulocytes 1 %   Abs Immature Granulocytes 0.11 (H) 0.00 - 0.07 K/uL    Comment: Performed at [facility], [address], [place], [place] [address]  Basic metabolic panel     Status: Abnormal   Collection Time: 01/23/2020  7:09 PM  Result Value Ref Range   Sodium 131 (L) 135 - 145 mmol/L   Potassium 3.4 (L) 3.5 - 5.1 mmol/L   Chloride 86 (L) 98 - 111 mmol/L   CO2 9 (L) 22 - 32 mmol/L   Glucose, Bld <20 (LL) 70 - 99 mg/dL    Comment: REPEATED TO VERIFY CRITICAL RESULT CALLED TO, READ BACK BY AND VERIFIED WITH: D.SIDBERRY RN 2023 01/30/2020 MCCORMICK K    BUN <5 (L) 6 - 20 mg/dL   Creatinine, Ser 1.10 (H) 0.44 - 1.00 mg/dL   Calcium 8.7 (L) 8.9 - 10.3 mg/dL   GFR, Estimated >60 >60 mL/min   Anion gap 36 (H) 5 - 15    Comment: Performed at [facility] [address], [place], [place] [address]  Hepatic function panel     Status: Abnormal   Collection Time: 02/03/2020  7:09 PM  Result Value Ref Range   Total Protein 6.6 6.5 - 8.1 g/dL   Albumin 1.6 (L) 3.5 - 5.0 g/dL   AST 480 (H) 15 - 41 U/L   ALT 100 (H) 0 - 44 U/L    Comment: RESULTS CONFIRMED BY MANUAL DILUTION   Alkaline Phosphatase 202 (H) 38 - 126 U/L   Total Bilirubin 13.2 (H) 0.3 - 1.2 mg/dL   Bilirubin, Direct  7.0 (H) 0.0 - 0.2 mg/dL   Indirect Bilirubin 6.2 (H) 0.3 - 0.9 mg/dL    Comment: Performed at [place] [address] [place], [place] [address]  Ethanol     Status: None   Collection Time: 01/05/2020  7:09 PM  Result Value Ref Range   Alcohol, Ethyl (B) <10 <10 mg/dL    Comment: (NOTE) Lowest detectable limit for serum alcohol is 10 mg/dL.  For medical purposes only. Performed at [facility], [place] [address] [place] [address] [place], [place] [address]   Protime-INR     Status: Abnormal   Collection Time: 02/02/2020  7:09 PM  Result Value Ref Range   Prothrombin Time 45.8 (H) 11.4 - 15.2 seconds   INR 5.1 (HH) 0.8 - 1.2    Comment: REPEATED TO VERIFY CRITICAL RESULT CALLED TO, READ BACK BY AND VERIFIED WITH: D SIGNBURG,RN 2012 02/02/2020 WBOND (NOTE) INR goal varies based on device and disease states. Performed at [facility], [place] [address], [place], [place] [address]   Acetaminophen level     Status: Abnormal   Collection Time: 01/27/2020  7:09 PM  Result Value Ref Range   Acetaminophen (Tylenol), Serum <10 (L) 10 - 30 ug/mL    Comment: (  NOTE) Therapeutic concentrations vary significantly. A range of 10-30 ug/mL  may be an effective concentration for many patients. However, some  are best treated at concentrations outside of this range. Acetaminophen concentrations >150 ug/mL at 4 hours after ingestion  and >50 ug/mL at 12 hours after ingestion are often associated with  toxic reactions.  Performed at [facility], [place] [address] [place], [place] [address]   ABO/Rh     Status: None   Collection Time: 01/05/2020  7:09 PM  Result Value Ref Range   ABO/RH(D)      O POS Performed at [place] [address], [place], [place] [address]   I-Stat beta hCG blood, ED     Status: None   Collection Time: 01/29/2020  7:14 PM  Result Value Ref Range   I-stat hCG, quantitative <5.0 <5 mIU/mL   Comment 3            Comment:   GEST. AGE      CONC.  (mIU/mL)    <=1 WEEK        5 - 50     2 WEEKS       50 - 500     3 WEEKS       100 - 10,000     4 WEEKS     1,000 - 30,000        FEMALE AND NON-PREGNANT FEMALE:     LESS THAN 5 mIU/mL   Respiratory Panel by RT PCR (Flu A&B, Covid) -     Status: None   Collection Time: 01/05/2020  7:53 PM   Specimen: Nasopharyngeal  Result Value Ref Range   SARS Coronavirus 2 by RT PCR NEGATIVE NEGATIVE    Comment: (NOTE) SARS-CoV-2 target nucleic acids are NOT DETECTED.  The SARS-CoV-2 RNA is generally detectable in upper respiratoy specimens during the acute phase of infection. The lowest concentration of SARS-CoV-2 viral copies this assay can detect is 131 copies/mL. A negative result does not preclude SARS-Cov-2 infection and should not be used as the sole basis for treatment or other patient management decisions. A negative result may occur with  improper specimen collection/handling, submission of specimen other than nasopharyngeal swab, presence of viral mutation(s) within the areas targeted by this assay, and inadequate number of viral copies (<131 copies/mL). A negative result must be combined with clinical observations, patient history, and epidemiological information. The expected result is Negative.  Fact Sheet for Patients:  PinkCheek.be  Fact Sheet for Healthcare Providers:  GravelBags.it  This test is no t yet approved or cleared by the [place] FDA and  has been authorized for detection and/or diagnosis of SARS-CoV-2 by FDA under an Emergency Use Authorization (EUA). This EUA will remain  in effect (meaning this test can be used) for the duration of the COVID-19 declaration under Section 564(b)(1) of the Act, 21 U.S.C. section 360bbb-3(b)(1), unless the authorization is terminated or revoked sooner.     Influenza A by PCR NEGATIVE NEGATIVE   Influenza B by PCR NEGATIVE NEGATIVE    Comment: (NOTE) The Xpert Xpress  SARS-CoV-2/FLU/RSV assay is intended as an aid in  the diagnosis of influenza from Nasopharyngeal swab specimens and  should not be used as a sole basis for treatment. Nasal washings and  aspirates are unacceptable for Xpert Xpress SARS-CoV-2/FLU/RSV  testing.  Fact Sheet for Patients: PinkCheek.be  Fact Sheet for Healthcare Providers: GravelBags.it  This test is not yet approved or cleared by the  [place] Architectural technologist and  has been authorized for detection and/or diagnosis of SARS-CoV-2 by  FDA under an Print production planner (EUA). This EUA will remain  in effect (meaning this test can be used) for the duration of the  Covid-19 declaration under Section 564(b)(1) of the Act, 21  U.S.C. section 360bbb-3(b)(1), unless the authorization is  terminated or revoked. Performed at [facility], [place] [address], [place], [place] [address]   CBG monitoring, ED     Status: Abnormal   Collection Time: 01/20/2020  7:59 PM  Result Value Ref Range   Glucose-Capillary <10 (LL) 70 - 99 mg/dL    Comment: Glucose reference range applies only to samples taken after fasting for at least 8 hours.  CBG monitoring, ED     Status: Abnormal   Collection Time: 01/05/2020  8:05 PM  Result Value Ref Range   Glucose-Capillary <10 (LL) 70 - 99 mg/dL    Comment: Glucose reference range applies only to samples taken after fasting for at least 8 hours.  CBG monitoring, ED     Status: Abnormal   Collection Time: 01/04/2020  8:30 PM  Result Value Ref Range   Glucose-Capillary 124 (H) 70 - 99 mg/dL    Comment: Glucose reference range applies only to samples taken after fasting for at least 8 hours.  CBG monitoring, ED     Status: Abnormal   Collection Time: 01/16/2020  8:49 PM  Result Value Ref Range   Glucose-Capillary 105 (H) 70 - 99 mg/dL    Comment: Glucose reference range applies only to samples taken after fasting for at least 8 hours.  Urine  culture     Status: Abnormal (Preliminary result)   Collection Time: 01/31/2020  8:51 PM   Specimen: Urine, Random  Result Value Ref Range   Specimen Description URINE, RANDOM    Special Requests NONE    Culture (A)     >=100,000 COLONIES/mL ESCHERICHIA COLI CULTURE REINCUBATED FOR BETTER GROWTH Performed at [facility], [place] [address], [place], [place] [address]    Report Status PENDING    Organism ID, Bacteria ESCHERICHIA COLI (A)       Susceptibility   Escherichia coli - MIC*    AMPICILLIN <=2 SENSITIVE Sensitive     CEFAZOLIN <=4 SENSITIVE Sensitive     CEFTRIAXONE <=0.25 SENSITIVE Sensitive     CIPROFLOXACIN <=0.25 SENSITIVE Sensitive     GENTAMICIN <=1 SENSITIVE Sensitive     IMIPENEM <=0.25 SENSITIVE Sensitive     NITROFURANTOIN <=16 SENSITIVE Sensitive     TRIMETH/SULFA <=20 SENSITIVE Sensitive     AMPICILLIN/SULBACTAM <=2 SENSITIVE Sensitive     PIP/TAZO <=4 SENSITIVE Sensitive     * >=100,000 COLONIES/mL ESCHERICHIA COLI  Rapid urine drug screen (hospital performed)     Status: None   Collection Time: 01/16/2020  8:51 PM  Result Value Ref Range   Opiates NONE DETECTED NONE DETECTED   Cocaine NONE DETECTED NONE DETECTED   Benzodiazepines NONE DETECTED NONE DETECTED   Amphetamines NONE DETECTED NONE DETECTED   Tetrahydrocannabinol NONE DETECTED NONE DETECTED   Barbiturates NONE DETECTED NONE DETECTED    Comment: (NOTE) DRUG SCREEN FOR MEDICAL PURPOSES ONLY.  IF CONFIRMATION IS NEEDED FOR ANY PURPOSE, NOTIFY LAB WITHIN 5 DAYS.  LOWEST DETECTABLE LIMITS FOR URINE DRUG SCREEN Drug Class                     Cutoff (ng/mL) Amphetamine and metabolites  1000 Barbiturate and metabolites    200 Benzodiazepine                 219 Tricyclics and metabolites     300 Opiates and metabolites        300 Cocaine and metabolites        300 THC                            50 Performed at [facility], [place] [address] [place], [place] [address]   CBG monitoring,  ED     Status: None   Collection Time: 01/08/2020  9:29 PM  Result Value Ref Range   Glucose-Capillary 88 70 - 99 mg/dL    Comment: Glucose reference range applies only to samples taken after fasting for at least 8 hours.  Procalcitonin - Baseline     Status: None   Collection Time: 02/02/2020 10:21 PM  Result Value Ref Range   Procalcitonin 0.51 ng/mL    Comment:        Interpretation: PCT > 0.5 ng/mL and <= 2 ng/mL: Systemic infection (sepsis) is possible, but other conditions are known to elevate PCT as well. (NOTE)       Sepsis PCT Algorithm           Lower Respiratory Tract                                      Infection PCT Algorithm    ----------------------------     ----------------------------         PCT < 0.25 ng/mL                PCT < 0.10 ng/mL          Strongly encourage             Strongly discourage   discontinuation of antibiotics    initiation of antibiotics    ----------------------------     -----------------------------       PCT 0.25 - 0.50 ng/mL            PCT 0.10 - 0.25 ng/mL               OR       >80% decrease in PCT            Discourage initiation of                                            antibiotics      Encourage discontinuation           of antibiotics    ----------------------------     -----------------------------         PCT >= 0.50 ng/mL              PCT 0.26 - 0.50 ng/mL                AND       <80% decrease in PCT             Encourage initiation of  antibiotics       Encourage continuation           of antibiotics    ----------------------------     -----------------------------        PCT >= 0.50 ng/mL                  PCT > 0.50 ng/mL               AND         increase in PCT                  Strongly encourage                                      initiation of antibiotics    Strongly encourage escalation           of antibiotics                                      -----------------------------                                           PCT <= 0.25 ng/mL                                                 OR                                        > 80% decrease in PCT                                      Discontinue / Do not initiate                                             antibiotics  Performed at [facility], [address] [place], [place] [address]   HIV Antibody (routine testing w rflx)     Status: None   Collection Time: 01/21/2020 10:21 PM  Result Value Ref Range   HIV Screen 4th Generation wRfx Non Reactive Non Reactive    Comment: Performed at [facility], [place] [address] [place], [place] [address]  Lipase, blood     Status: None   Collection Time: 01/22/2020 10:21 PM  Result Value Ref Range   Lipase 22 11 - 51 U/L    Comment: Performed at [facility] [address], [place], [place] [address]  Lactic acid, plasma     Status: Abnormal   Collection Time: 01/09/2020 10:24 PM  Result Value Ref Range   Lactic Acid, Venous >11.0 (HH) 0.5 - 1.9 mmol/L    Comment: CRITICAL VALUE NOTED.  VALUE IS CONSISTENT WITH PREVIOUSLY REPORTED AND CALLED VALUE. ICTERIC SPECIMEN Performed at [facility], [address], [place], [place] [address]   I-Stat arterial blood gas, ED     Status: Abnormal   Collection Time: 01/22/2020 10:50 PM  Result Value Ref Range   pH, Arterial 7.436 7.35 - 7.45   pCO2 arterial 16.0 (LL) 32 - 48 mmHg   pO2, Arterial 28 (LL) 83 - 108 mmHg   Bicarbonate 11.3 (L) 20.0 - 28.0 mmol/L   TCO2 12 (L) 22 - 32 mmol/L   O2 Saturation 73.0 %   Acid-base deficit 13.0 (H) 0.0 - 2.0 mmol/L   Sodium 130 (L) 135 - 145 mmol/L   Potassium 3.1 (L) 3.5 - 5.1 mmol/L   Calcium, Ion 0.98 (L) 1.15 - 1.40 mmol/L   HCT 23.0 (L) 36 - 46 %   Hemoglobin 7.8 (L) 12.0 - 15.0 g/dL   Patient temperature 90.4 F    Collection site Radial    Drawn by RT    Sample type ARTERIAL    Comment NOTIFIED PHYSICIAN   MRSA PCR  Screening     Status: None   Collection Time: 01/31/2020 11:50 PM   Specimen: Nasal Mucosa; Nasopharyngeal  Result Value Ref Range   MRSA by PCR NEGATIVE NEGATIVE    Comment:        The GeneXpert MRSA Assay (FDA approved for NASAL specimens only), is one component of a comprehensive MRSA colonization surveillance program. It is not intended to diagnose MRSA infection nor to guide or monitor treatment for MRSA infections. Performed at [facility], [place] [address] [place], [place] [address]   Glucose, capillary     Status: None   Collection Time: 01/04/2020 11:51 PM  Result Value Ref Range   Glucose-Capillary 70 70 - 99 mg/dL    Comment: Glucose reference range applies only to samples taken after fasting for at least 8 hours.  Lactic acid, plasma     Status: Abnormal   Collection Time: 01/16/20  1:03 AM  Result Value Ref Range   Lactic Acid, Venous >11.0 (HH) 0.5 - 1.9 mmol/L    Comment: CRITICAL VALUE NOTED.  VALUE IS CONSISTENT WITH PREVIOUSLY REPORTED AND CALLED VALUE. Performed at [facility], [place] [address] [place], [place] [address]   Glucose, capillary     Status: None   Collection Time: 01/16/20  1:43 AM  Result Value Ref Range   Glucose-Capillary 78 70 - 99 mg/dL    Comment: Glucose reference range applies only to samples taken after fasting for at least 8 hours.  ABO/Rh     Status: None   Collection Time: 01/16/20  3:23 AM  Result Value Ref Range   ABO/RH(D)      O POS Performed at [facility] [address] [place], [place] [address]   Blood Cultures (routine x 2)     Status: None (Preliminary result)   Collection Time: 01/16/20  3:24 AM   Specimen: BLOOD LEFT HAND  Result Value Ref Range   Specimen Description BLOOD LEFT HAND    Special Requests AEROBIC BOTTLE ONLY Blood Culture adequate volume    Culture      NO GROWTH 1 DAY Performed at [facility], [place] [address] [place],  [address]    Report Status PENDING    Procalcitonin     Status: None   Collection Time: 01/16/20  3:40 AM  Result Value Ref Range   Procalcitonin 0.54 ng/mL    Comment:  Interpretation: PCT > 0.5 ng/mL and <= 2 ng/mL: Systemic infection (sepsis) is possible, but other conditions are known to elevate PCT as well. (NOTE)       Sepsis PCT Algorithm           Lower Respiratory Tract                                      Infection PCT Algorithm    ----------------------------     ----------------------------         PCT < 0.25 ng/mL                PCT < 0.10 ng/mL          Strongly encourage             Strongly discourage   discontinuation of antibiotics    initiation of antibiotics    ----------------------------     -----------------------------       PCT 0.25 - 0.50 ng/mL            PCT 0.10 - 0.25 ng/mL               OR       >80% decrease in PCT            Discourage initiation of                                            antibiotics      Encourage discontinuation           of antibiotics    ----------------------------     -----------------------------         PCT >= 0.50 ng/mL              PCT 0.26 - 0.50 ng/mL                AND       <80% decrease in PCT             Encourage initiation of                                             antibiotics       Encourage continuation           of antibiotics    ----------------------------     -----------------------------        PCT >= 0.50 ng/mL                  PCT > 0.50 ng/mL               AND         increase in PCT                  Strongly encourage                                      initiation of antibiotics    Strongly encourage escalation           of antibiotics                                     -----------------------------  PCT <= 0.25 ng/mL                                                 OR                                        > 80% decrease in PCT                                      Discontinue  / Do not initiate                                             antibiotics  Performed at [facility], [place] [address], [place], [place] [address]   Lactic acid, plasma     Status: Abnormal   Collection Time: 01/16/20  3:40 AM  Result Value Ref Range   Lactic Acid, Venous >11.0 (HH) 0.5 - 1.9 mmol/L    Comment: CRITICAL VALUE NOTED.  VALUE IS CONSISTENT WITH PREVIOUSLY REPORTED AND CALLED VALUE. Performed at [facility], [place] [address], [place], [place] [address]   Basic metabolic panel     Status: Abnormal   Collection Time: 01/16/20  3:40 AM  Result Value Ref Range   Sodium 133 (L) 135 - 145 mmol/L   Potassium 3.7 3.5 - 5.1 mmol/L   Chloride 90 (L) 98 - 111 mmol/L   CO2 9 (L) 22 - 32 mmol/L   Glucose, Bld 60 (L) 70 - 99 mg/dL    Comment: Glucose reference range applies only to samples taken after fasting for at least 8 hours.   BUN 7 6 - 20 mg/dL   Creatinine, Ser 1.22 (H) 0.44 - 1.00 mg/dL   Calcium 8.4 (L) 8.9 - 10.3 mg/dL   GFR, Estimated 54 (L) >60 mL/min   Anion gap 34 (H) 5 - 15    Comment: Performed at [place] [address], [place], [place] [address]  CBC     Status: Abnormal   Collection Time: 01/16/20  3:40 AM  Result Value Ref Range   WBC 8.8 4.0 - 10.5 K/uL   RBC 1.80 (L) 3.87 - 5.11 MIL/uL   Hemoglobin 7.2 (L) 12.0 - 15.0 g/dL   HCT 22.9 (L) 36 - 46 %   MCV 127.2 (H) 80.0 - 100.0 fL   MCH 40.0 (H) 26.0 - 34.0 pg   MCHC 31.4 30.0 - 36.0 g/dL   RDW 19.3 (H) 11.5 - 15.5 %   Platelets 68 (L) 150 - 400 K/uL    Comment: Immature Platelet Fraction may be clinically indicated, consider ordering this additional test [id number] CONSISTENT WITH PREVIOUS RESULT    nRBC 1.1 (H) 0.0 - 0.2 %    Comment: Performed at [facility], [place] [address], [place], [place] [address]  Magnesium     Status: None   Collection Time: 01/16/20  3:40 AM  Result Value Ref Range   Magnesium 2.3 1.7 - 2.4 mg/dL    Comment: Performed at [facility], [address], [place], [place] [address]  Phosphorus     Status: None   Collection Time: 01/16/20  3:40 AM  Result Value Ref Range   Phosphorus 4.1 2.5 - 4.6 mg/dL    Comment: Performed at [facility] [address], [place], [place] [address]  Hepatic function panel     Status: Abnormal   Collection Time: 01/16/20  3:40 AM  Result Value Ref Range   Total Protein 6.1 (L) 6.5 - 8.1 g/dL   Albumin 2.0 (L) 3.5 - 5.0 g/dL   AST 841 (H) 15 - 41 U/L   ALT 107 (H) 0 - 44 U/L    Comment: RESULTS CONFIRMED BY MANUAL DILUTION   Alkaline Phosphatase 147 (H) 38 - 126 U/L   Total Bilirubin 12.8 (H) 0.3 - 1.2 mg/dL   Bilirubin, Direct 7.1 (H) 0.0 - 0.2 mg/dL   Indirect Bilirubin 5.7 (H) 0.3 - 0.9 mg/dL    Comment: Performed at [facility] [address], [place], [place] [address]  Protime-INR     Status: Abnormal   Collection Time: 01/16/20  3:40 AM  Result Value Ref Range   Prothrombin Time 64.9 (H) 11.4 - 15.2 seconds   INR 8.0 (HH) 0.8 - 1.2    Comment: REPEATED TO VERIFY CRITICAL RESULT CALLED TO, READ BACK BY AND VERIFIED WITH: Hampton Abbot, RN [phone] 01/16/2020 BY MACEDA,J. (NOTE) INR goal varies based on device and disease states. Performed at [facility], [place] [address], [place], [place] [address]   Glucose, capillary     Status: Abnormal   Collection Time: 01/16/20  3:47 AM  Result Value Ref Range   Glucose-Capillary 61 (L) 70 - 99 mg/dL    Comment: Glucose reference range applies only to samples taken after fasting for at least 8 hours.  Glucose, capillary     Status: Abnormal   Collection Time: 01/16/20  4:12 AM  Result Value Ref Range   Glucose-Capillary 145 (H) 70 - 99 mg/dL    Comment: Glucose reference range applies only to samples taken after fasting for at least 8 hours.  Glucose, capillary     Status: Abnormal   Collection Time: 01/16/20  5:09 AM  Result Value Ref Range   Glucose-Capillary 134 (H) 70 - 99 mg/dL    Comment: Glucose reference range  applies only to samples taken after fasting for at least 8 hours.  Osmolality     Status: Abnormal   Collection Time: 01/16/20  8:13 AM  Result Value Ref Range   Osmolality 306 (H) 275 - 295 mOsm/kg    Comment: Performed at [facility] [address] [place],  [address]  IgG     Status: None   Collection Time: 01/16/20  8:13 AM  Result Value Ref Range   IgG (Immunoglobin G), Serum 1,492 586 - 1,602 mg/dL    Comment: (NOTE) Performed At: [facility] [place], [place] [id number] Rush Farmer MD [id number]   Hepatitis panel, acute     Status: None   Collection Time: 01/16/20  8:13 AM  Result Value Ref Range   Hepatitis B Surface Ag NON REACTIVE NON REACTIVE   HCV Ab NON REACTIVE NON REACTIVE    Comment: (NOTE) Nonreactive HCV antibody screen is consistent with no HCV infections,  unless recent infection is suspected or other evidence exists to indicate HCV infection.     Hep A IgM NON REACTIVE NON REACTIVE  Hep B C IgM NON REACTIVE NON REACTIVE    Comment: Performed at [facility], [place] [address], [place], [place] [address]  Ceruloplasmin     Status: Abnormal   Collection Time: 01/16/20  8:13 AM  Result Value Ref Range   Ceruloplasmin 17.5 (L) 19.0 - 39.0 mg/dL    Comment: (NOTE) Performed At: [facility] [place], [place] [id number] Rush Farmer MD [id number]   Prepare fresh frozen plasma     Status: None   Collection Time: 01/16/20  8:32 AM  Result Value Ref Range   Unit Number [id number]    Blood Component Type THW PLS APHR    Unit division B0    Status of Unit ISSUED,FINAL    Transfusion Status OK TO TRANSFUSE    Unit Number [id number]    Blood Component Type THW PLS APHR    Unit division B0    Status of Unit ISSUED,FINAL    Transfusion Status OK TO TRANSFUSE    Unit Number [id number]    Blood Component Type THAWED PLASMA    Unit division 00    Status of Unit ISSUED,FINAL     Transfusion Status      OK TO TRANSFUSE Performed at [facility], [place] [address], [place],  [address]    Unit Number [id number]    Blood Component Type THAWED PLASMA    Unit division 00    Status of Unit ISSUED,FINAL    Transfusion Status OK TO TRANSFUSE   Blood gas, arterial     Status: Abnormal   Collection Time: 01/16/20 11:30 AM  Result Value Ref Range   FIO2 100.00    pH, Arterial 7.171 (LL) 7.35 - 7.45    Comment: CRITICAL RESULT CALLED TO, READ BACK BY AND VERIFIED WITH: M WOOD,RN 01/16/2020 1148 WILDERK    pCO2 arterial 41.7 32 - 48 mmHg   pO2, Arterial 221 (H) 83 - 108 mmHg   Bicarbonate 14.6 (L) 20.0 - 28.0 mmol/L   Acid-base deficit 12.3 (H) 0.0 - 2.0 mmol/L   O2 Saturation 98.7 %   Patient temperature 37.0    Collection site A-LINE    Drawn by B PRESTON    Sample type ARTERIAL DRAW     Comment: Performed at [facility], [place] [address] [place], [place] [address]  Glucose, capillary     Status: Abnormal   Collection Time: 01/16/20 12:41 PM  Result Value Ref Range   Glucose-Capillary 146 (H) 70 - 99 mg/dL    Comment: Glucose reference range applies only to samples taken after fasting for at least 8 hours.  Basic metabolic panel     Status: Abnormal   Collection Time: 01/16/20  4:24 PM  Result Value Ref Range   Sodium 138 135 - 145 mmol/L   Potassium 2.4 (LL) 3.5 - 5.1 mmol/L    Comment: CRITICAL RESULT CALLED TO, READ BACK BY AND VERIFIED WITH: M.WOOD,RN _0  01/16/2020 VANG.J    Chloride 90 (L) 98 - 111 mmol/L   CO2 18 (L) 22 - 32 mmol/L   Glucose, Bld 180 (H) 70 - 99 mg/dL    Comment: Glucose reference range applies only to samples taken after fasting for at least 8 hours.   BUN 6 6 - 20 mg/dL   Creatinine, Ser 1.57 (H) 0.44 - 1.00 mg/dL   Calcium 8.9 8.9 - 10.3 mg/dL   GFR, Estimated 40 (L) >60 mL/min   Anion gap 30 (H) 5 -  15    Comment: Performed at [facility], [place] [address] [place] [address]  CBC      Status: Abnormal   Collection Time: 01/16/20  4:24 PM  Result Value Ref Range   WBC 13.0 (H) 4.0 - 10.5 K/uL   RBC 1.52 (L) 3.87 - 5.11 MIL/uL   Hemoglobin 6.0 (LL) 12.0 - 15.0 g/dL    Comment: REPEATED TO VERIFY THIS CRITICAL RESULT HAS VERIFIED AND BEEN CALLED TO M WOOD RN BY KIRSTENE FORSYTH ON 10 13 2021 AT 1656, AND HAS BEEN READ BACK.     HCT 18.8 (L) 36 - 46 %   MCV 123.7 (H) 80.0 - 100.0 fL   MCH 39.5 (H) 26.0 - 34.0 pg   MCHC 31.9 30.0 - 36.0 g/dL   RDW 19.2 (H) 11.5 - 15.5 %   Platelets 76 (L) 150 - 400 K/uL    Comment: REPEATED TO VERIFY Immature Platelet Fraction may be clinically indicated, consider ordering this additional test [id number] CONSISTENT WITH PREVIOUS RESULT    nRBC 1.1 (H) 0.0 - 0.2 %    Comment: Performed at [facility] [address], [place], [place] [address]  Protime-INR     Status: Abnormal   Collection Time: 01/16/20  4:24 PM  Result Value Ref Range   Prothrombin Time 33.6 (H) 11.4 - 15.2 seconds   INR 3.4 (H) 0.8 - 1.2    Comment: (NOTE) INR goal varies based on device and disease states. Performed at [facility], [place] [address] [place], [place] [address]   Lactic acid, plasma     Status: Abnormal   Collection Time: 01/16/20  4:27 PM  Result Value Ref Range   Lactic Acid, Venous >11 (HH) 0.5 - 1.9 mmol/L    Comment: ICTERUS AT THIS LEVEL MAY AFFECT RESULT CRITICAL VALUE NOTED.  VALUE IS CONSISTENT WITH PREVIOUSLY REPORTED AND CALLED VALUE. Performed at [facility], [place] [address] [place], [place] [address]   Blood gas, arterial     Status: Abnormal   Collection Time: 01/16/20  4:30 PM  Result Value Ref Range   FIO2 60.00    pH, Arterial 7.362 7.35 - 7.45   pCO2 arterial 32.7 32 - 48 mmHg   pO2, Arterial 69.2 (L) 83 - 108 mmHg   Bicarbonate 18.1 (L) 20.0 - 28.0 mmol/L   Acid-base deficit 6.3 (H) 0.0 - 2.0 mmol/L   O2 Saturation 89.4 %   Patient temperature 37.0    Collection site A LINE    Drawn by B  PRESTON,RRT    Sample type ARTERIAL     Comment: Performed at [facility] [address] [place], [place] [address]  Glucose, capillary     Status: Abnormal   Collection Time: 01/16/20  4:32 PM  Result Value Ref Range   Glucose-Capillary 164 (H) 70 - 99 mg/dL    Comment: Glucose reference range applies only to samples taken after fasting for at least 8 hours.  Type and screen [place]     Status: None   Collection Time: 01/16/20  6:25 PM  Result Value Ref Range   ABO/RH(D) O POS    Antibody Screen NEG    Sample Expiration 01-30-20,2359    Unit Number [id number]    Blood Component Type RED CELLS,LR    Unit division 00    Status of Unit ISSUED,FINAL    Transfusion Status OK TO TRANSFUSE  Crossmatch Result      Compatible Performed at [facility], [place] [address] [place], [place] [address]    Unit Number [id number]    Blood Component Type RED CELLS,LR    Unit division 00    Status of Unit ISSUED,FINAL    Transfusion Status OK TO TRANSFUSE    Crossmatch Result Compatible   Prepare RBC (crossmatch)     Status: None   Collection Time: 01/16/20  6:30 PM  Result Value Ref Range   Order Confirmation      ORDER PROCESSED BY BLOOD BANK Performed at [facility], [place] [address] [place], [place] [address]   Glucose, capillary     Status: Abnormal   Collection Time: 01/16/20  7:41 PM  Result Value Ref Range   Glucose-Capillary 154 (H) 70 - 99 mg/dL    Comment: Glucose reference range applies only to samples taken after fasting for at least 8 hours.  Glucose, capillary     Status: Abnormal   Collection Time: 01/16/20 11:42 PM  Result Value Ref Range   Glucose-Capillary 127 (H) 70 - 99 mg/dL    Comment: Glucose reference range applies only to samples taken after fasting for at least 8 hours.  Procalcitonin     Status: None   Collection Time: 01/17/20  2:12 AM  Result Value Ref Range   Procalcitonin 3.54 ng/mL    Comment:         Interpretation: PCT > 2 ng/mL: Systemic infection (sepsis) is likely, unless other causes are known. (NOTE)       Sepsis PCT Algorithm           Lower Respiratory Tract                                      Infection PCT Algorithm    ----------------------------     ----------------------------         PCT < 0.25 ng/mL                PCT < 0.10 ng/mL          Strongly encourage             Strongly discourage   discontinuation of antibiotics    initiation of antibiotics    ----------------------------     -----------------------------       PCT 0.25 - 0.50 ng/mL            PCT 0.10 - 0.25 ng/mL               OR       >80% decrease in PCT            Discourage initiation of                                            antibiotics      Encourage discontinuation           of antibiotics    ----------------------------     -----------------------------         PCT >= 0.50 ng/mL              PCT 0.26 - 0.50 ng/mL               AND       <  80% decrease in PCT              Encourage initiation of                                             antibiotics       Encourage continuation           of antibiotics    ----------------------------     -----------------------------        PCT >= 0.50 ng/mL                  PCT > 0.50 ng/mL               AND         increase in PCT                  Strongly encourage                                      initiation of antibiotics    Strongly encourage escalation           of antibiotics                                     -----------------------------                                           PCT <= 0.25 ng/mL                                                 OR                                        > 80% decrease in PCT                                      Discontinue / Do not initiate                                             antibiotics  Performed at [facility], [address], [place], [place] [address]   Comprehensive metabolic  panel     Status: Abnormal   Collection Time: 01/17/20  2:12 AM  Result Value Ref Range   Sodium 136 135 - 145 mmol/L   Potassium 3.1 (L) 3.5 - 5.1 mmol/L   Chloride 91 (L) 98 - 111 mmol/L   CO2 23 22 - 32 mmol/L   Glucose, Bld 163 (H) 70 - 99 mg/dL    Comment: Glucose reference range applies only to samples taken after fasting for at least 8 hours.   BUN 9 6 -  20 mg/dL   Creatinine, Ser 2.09 (H) 0.44 - 1.00 mg/dL   Calcium 8.6 (L) 8.9 - 10.3 mg/dL   Total Protein 6.2 (L) 6.5 - 8.1 g/dL   Albumin 2.1 (L) 3.5 - 5.0 g/dL   AST 2,242 (H) 15 - 41 U/L   ALT 187 (H) 0 - 44 U/L   Alkaline Phosphatase 170 (H) 38 - 126 U/L   Total Bilirubin 18.7 (HH) 0.3 - 1.2 mg/dL    Comment: CRITICAL RESULT CALLED TO, READ BACK BY AND VERIFIED WITH: RN S OWNLEY _0  01/17/20 BY S GEZAHEGN    GFR, Estimated 28 (L) >60 mL/min   Anion gap 22 (H) 5 - 15    Comment: Performed at [facility], [place] [address] [place], [place] [address]  CBC     Status: Abnormal   Collection Time: 01/17/20  2:12 AM  Result Value Ref Range   WBC 15.0 (H) 4.0 - 10.5 K/uL   RBC 2.44 (L) 3.87 - 5.11 MIL/uL   Hemoglobin 8.8 (L) 12.0 - 15.0 g/dL    Comment: REPEATED TO VERIFY POST TRANSFUSION SPECIMEN    HCT 26.4 (L) 36 - 46 %   MCV 108.2 (H) 80.0 - 100.0 fL    Comment: REPEATED TO VERIFY DELTA CHECK NOTED    MCH 36.1 (H) 26.0 - 34.0 pg   MCHC 33.3 30.0 - 36.0 g/dL   RDW 25.7 (H) 11.5 - 15.5 %   Platelets 76 (L) 150 - 400 K/uL    Comment: REPEATED TO VERIFY Immature Platelet Fraction may be clinically indicated, consider ordering this additional test [id number]    nRBC 0.9 (H) 0.0 - 0.2 %    Comment: Performed at [facility], [place] [address] [place], [place] [address]  Glucose, capillary     Status: Abnormal   Collection Time: 01/17/20  3:32 AM  Result Value Ref Range   Glucose-Capillary 143 (H) 70 - 99 mg/dL    Comment: Glucose reference range applies only to samples taken after fasting for at least 8  hours.  Blood gas, arterial     Status: Abnormal   Collection Time: 01/17/20  8:30 AM  Result Value Ref Range   FIO2 60.00    pH, Arterial 7.505 (H) 7.35 - 7.45   pCO2 arterial 32.5 32 - 48 mmHg   pO2, Arterial 82.7 (L) 83 - 108 mmHg   Bicarbonate 25.4 20.0 - 28.0 mmol/L   Acid-Base Excess 2.4 (H) 0.0 - 2.0 mmol/L   O2 Saturation 96.4 %   Patient temperature 37.0    Collection site A-LINE    Drawn by COLLECTED BY RT     Comment: B. PRESTON   Sample type ARTERIAL DRAW     Comment: Performed at [facility], [place] [address] [place], [place] [address]  Glucose, capillary     Status: Abnormal   Collection Time: 01/17/20  8:45 AM  Result Value Ref Range   Glucose-Capillary 152 (H) 70 - 99 mg/dL    Comment: Glucose reference range applies only to samples taken after fasting for at least 8 hours.  Glucose, capillary     Status: Abnormal   Collection Time: 01/17/20 11:51 AM  Result Value Ref Range   Glucose-Capillary 150 (H) 70 - 99 mg/dL    Comment: Glucose reference range applies only to samples taken after fasting for at least 8 hours.  CBC     Status: Abnormal   Collection Time: 01/17/20  1:06  PM  Result Value Ref Range   WBC 12.5 (H) 4.0 - 10.5 K/uL   RBC 2.45 (L) 3.87 - 5.11 MIL/uL   Hemoglobin 8.6 (L) 12.0 - 15.0 g/dL   HCT 26.3 (L) 36 - 46 %   MCV 107.3 (H) 80.0 - 100.0 fL   MCH 35.1 (H) 26.0 - 34.0 pg   MCHC 32.7 30.0 - 36.0 g/dL   RDW 25.3 (H) 11.5 - 15.5 %   Platelets 60 (L) 150 - 400 K/uL    Comment: REPEATED TO VERIFY Immature Platelet Fraction may be clinically indicated, consider ordering this additional test [id number] CONSISTENT WITH PREVIOUS RESULT    nRBC 1.5 (H) 0.0 - 0.2 %    Comment: Performed at [facility], [address], [place], [place] [address]  Lactic acid, plasma     Status: Abnormal   Collection Time: 01/17/20  1:06 PM  Result Value Ref Range   Lactic Acid, Venous 7.8 (HH) 0.5 - 1.9 mmol/L    Comment: ICTERUS AT THIS LEVEL MAY  AFFECT RESULT CRITICAL VALUE NOTED.  VALUE IS CONSISTENT WITH PREVIOUSLY REPORTED AND CALLED VALUE. Performed at [facility], [place] [address], [place], [place] [address]     DG Chest 1 View  Result Date: 01/22/2020 CLINICAL DATA:  Altered level of consciousness EXAM: CHEST  1 VIEW COMPARISON:  None. FINDINGS: Cardiac silhouette is accentuated by low lung volumes and portable technique. No visible pleural effusions or pneumothorax. No acute osseous abnormality. IMPRESSION: No evidence of acute cardiopulmonary disease, although evaluation of the lung bases is limited by portable semi erect technique. Electronically Signed   By: Margaretha Sheffield MD   On: 01/24/2020 20:06   CT HEAD WO CONTRAST  Result Date: 01/25/2020 CLINICAL DATA:  Mental status change, encephalopathy EXAM: CT HEAD WITHOUT CONTRAST TECHNIQUE: Contiguous axial images were obtained from the base of the skull through the vertex without intravenous contrast. COMPARISON:  CT head 04/02/2018 MRI 04/03/2018 FINDINGS: Brain: No evidence of acute infarction, hemorrhage, hydrocephalus, extra-axial collection, visible mass lesion or mass effect. Basal cisterns are patent. Slight CSF expansion of the sella, similar to prior. Remaining midline intracranial structures are unremarkable. Cerebellar tonsils normally positioned. Vascular: No hyperdense vessel or unexpected calcification. Skull: No calvarial fracture or suspicious osseous lesion. No scalp swelling or hematoma. Remote appearing right orbital floor fracture though new since comparisons in 2019. Sinuses/Orbits: Minimal thickening in the ethmoid sinuses. Remaining paranasal sinuses and mastoid air cells are predominantly clear. Right orbital floor fracture, as above. Included orbital contents are otherwise unremarkable. Other: None. IMPRESSION: 1. No acute intracranial abnormality. 2. Partially empty sella, similar to comparison. 3. Remote appearing right orbital floor fracture appears  remote though is a new finding since comparisons in 2019. Electronically Signed   By: Lovena Le M.D.   On: 01/31/2020 23:27   CT ABDOMEN PELVIS W CONTRAST  Result Date: 01/23/2020 CLINICAL DATA:  Abdominal pain, acute, nonlocalized. Alcohol use, Guillain Barre. Icterus. EXAM: CT ABDOMEN AND PELVIS WITH CONTRAST TECHNIQUE: Multidetector CT imaging of the abdomen and pelvis was performed using the standard protocol following bolus administration of intravenous contrast. CONTRAST:  190m OMNIPAQUE IOHEXOL 300 MG/ML  SOLN COMPARISON:  None. FINDINGS: Lower chest: Peribronchovascular consolidative airspace opacities within the lingula as well as left lower lobe. Patchy ground-glass airspace opacities within the right lower lobe. Consolidative patchy airspace opacities within the right upper lobe and right middle lobe. Hepatobiliary: The liver is enlarged measuring up to 25.5 cm. Markedly  diffuse Lea hypodense hepatic parenchyma compared to the spleen consistent with hepatic steatosis. No focal hepatic lesions; however, limited evaluation on this single venous contrast study. Prominent gallbladder wall which can be seen in chronic liver disease. No radiopaque gallstones identified. No biliary ductal dilatation. Pancreas: No focal lesion. Normal pancreatic contour. No surrounding inflammatory changes. No main pancreatic ductal dilatation. Spleen: Normal in size without focal abnormality. Adrenals/Urinary Tract: No adrenal nodule bilaterally. Bilateral kidneys enhance symmetrically. No hydronephrosis. No hydroureter. The urinary bladder is unremarkable. Stomach/Bowel: Stomach is within normal limits. The appendix not definitely identified. No evidence of bowel wall thickening, distention, or inflammatory changes. Fluid noted within the lumen of the colon; the fluid is noted to measure 60-80 Hounsfield units in density. Vascular/Lymphatic: No abdominal aorta or iliac aneurysm. No abdominal, pelvic, or inguinal  lymphadenopathy. the portal, splenic, superior mesenteric veins are patent. Reproductive: Suggestion of isodense pericentimeter soft tissue lesions within the right uterine wall suggestive of uterine fibroids no adnexal lesions. Other: No intraperitoneal free fluid. No intraperitoneal free gas. No organized fluid collection. Musculoskeletal: Mild subcutaneus soft tissue edema. No suspicious lytic or blastic osseous lesions. No acute displaced fracture. Multilevel degenerative changes of the spine. IMPRESSION: 1. Pulmonary findings suggestive of infection or inflammation (such as aspiration pneumonia). COVID-19 not excluded. Recommend short-term follow-up CT post treatment to exclude underlying lesion/malignancy. 2. Hepatomegaly and hepatic steatosis. 3. Fast state of transition/diarrhea with high density fluid within the lumen of the colon which may be due to PO contrast administration versus blood products. Correlate with physical exam and history. Electronically Signed   By: Iven Finn M.D.   On: 02/03/2020 23:55   DG CHEST PORT 1 VIEW  Result Date: 01/16/2020 CLINICAL DATA:  Follow-up intubation.  Altered mental status. EXAM: PORTABLE CHEST 1 VIEW COMPARISON:  01/30/2020 FINDINGS: Endotracheal tube tip is at the carina. Consider withdrawal 1 or 2 cm. Orogastric or nasogastric tube enters the stomach. Left internal jugular central line tip in the proximal right atrium. Withdraw 2 cm to be above the right atrium if desired. Newly seen/worsened infiltrate/atelectasis in the mid and lower lungs bilaterally. This could represent infectious pneumonia or aspiration. No pleural effusion. IMPRESSION: 1. Endotracheal tube tip at the carina. Consider withdrawal 1 or 2 cm. 2. Newly seen/worsened infiltrate/atelectasis in the mid and lower lungs bilaterally. This could represent infectious pneumonia or aspiration. Electronically Signed   By: Nelson Chimes M.D.   On: 01/16/2020 10:18   DG Abd Portable 1V  Result  Date: 01/16/2020 CLINICAL DATA:  Nasogastric tube placement EXAM: PORTABLE ABDOMEN - 1 VIEW COMPARISON:  None. FINDINGS: Tip and side port of the nasogastric tube project over the stomach. Nonobstructive bowel gas pattern. IMPRESSION: Nasogastric tube tip and side port in the stomach. Electronically Signed   By: Ulyses Jarred M.D.   On: 01/16/2020 01:18   EEG adult  Result Date: 01/16/2020 Lora Havens, MD     01/16/2020  8:15 AM Patient Name: Blasa Raisch MRN: [id number] Epilepsy Attending: Lora Havens Referring Physician/Provider: Dr. Rhys Martini Date: 01/16/2020 Duration: 25.29 mins Patient history: 44 year old female with history of extensive alcohol abuse was presented with altered mental status for last several days.  EEG to evaluate for seizures. Level of alertness: Awake/ lethargic, asleep AEDs during EEG study: Ativan Technical aspects: This EEG study was done with scalp electrodes positioned according to the 10-20 International system of electrode placement. Electrical activity was acquired at a sampling rate of _0  and reviewed with a high frequency  filter of _0  and a low frequency filter of _1 . EEG data were recorded continuously and digitally stored. Description: No posterior dominant rhythm was seen.  Sleep was characterized by sleep spindles (13 to 15 Hz), maximal frontocentral region.  EEG showed continuous generalized 3 to 6 Hz theta-delta slowing.  Hyperventilation and photic stimulation were not performed.   Of note, parts of EEG were difficult interpret due to significant electrode artifact. ABNORMALITY -Continuous slow, generalized IMPRESSION: This technically difficult study suggestive of moderate diffuse encephalopathy, nonspecific to etiology. No seizures or epileptiform discharges were seen throughout the recording. Priyanka Barbra Sarks    Assessment/Plan  1.  Acute oliguric kidney injury: Cr 1.1--> 2.06 in the setting of hypotension/ hypothermia/ likely septic  shock.  She is receiving albumin.  I suspect this is ATN rather than HRS; if desired trial of albumin and MAP of > 80 with norepi could be achieved but doubt would make much impact.  She is not making urine.  AST quite high- will check CK.  There are no acute indications for RRT at present but will need in the next 48 hrs or so. Her MELD is 38 and per GI not a transplant candidate, both of which make her a suboptimal RRT candidate.  Foley in place.    2.  Acute liver failure: likely alcoholic hepatitis; on stress dose steroids, got FFP, GI following  3.  Presumed hepatic encephalopathy: on lactulose, EEG without ictal activity  4.  Hypokalemia: gentle repletion  5.  Lactic acidosis: lactate improving somewhat; bicarb normalized on chemistries  6.  Anemia: on IV PPI BID, hgb 9.0--> 7.2--> 6.0 --> 8.8 after 1 u pRBCs, not reflective of overt GI bleeding, some coffee grounds from NG tube.  PPI as above, GI considering octreotide  7.  Septic shock 2/2: UTI and aspiration PNA: pressos, antibiotics (Zosyn) per PCCM  8.  Dispo: in ICU  Madelon Lips 01/17/2020, 3:20 PM

## 2020-01-17 NOTE — Progress Notes (Signed)
Progress Note   Subjective  Remains intubated, sedated with Precedex and as needed fentanyl Levophed has been weaned down to 4 mcg She remains on vasopressin infusion She received 3 units FFP yesterday, 1 of packed red cells She had coffee-ground emesis per OG yesterday, she was having diarrhea which was dark but not frankly melenic or bloody with lactulose Critical care team able to get more history from daughter and she has been drinking alcohol fairly heavily recently but quantity unknown On Zosyn, vancomycin stopped today   Objective  Vital signs in last 24 hours: Temp:  [95.2 F (35.1 C)-100.9 F (38.3 C)] 100.4 F (38 C) (10/14 1145) Pulse Rate:  [71-142] 71 (10/14 1145) Resp:  [19-37] 30 (10/14 1145) BP: (108)/(48) 108/48 (10/13 2004) SpO2:  [74 %-100 %] 91 % (10/14 1145) Arterial Line BP: (75-154)/(34-65) 129/57 (10/14 1145) FiO2 (%):  [60 %-70 %] 60 % (10/14 1058) Last BM Date: 01/16/20  Gen: intubated, agitated lying in bed moving all four extremities, nonpurposeful,  HEENT: icteric, ETT and OG in place CV: reg Pulm: course b/l anterioly Abd: soft, protuberant and tympanitic,  +BS throughout Ext: no c/c, 2+ LEE   Intake/Output from previous day: 10/13 0701 - 10/14 0700 In: 6780.2 [I.V.:3459.1; Blood:1717.9; NG/GT:345; IV Piggyback:1258.2] Out: 965 [Urine:15; Emesis/NG output:200; Stool:750] Intake/Output this shift: Total I/O In: 355.2 [I.V.:139.5; NG/GT:95; IV Piggyback:120.7] Out: 23 [Urine:23]  Lab Results: Recent Labs    01/16/20 0340 01/16/20 1624 01/17/20 0212  WBC 8.8 13.0* 15.0*  HGB 7.2* 6.0* 8.8*  HCT 22.9* 18.8* 26.4*  PLT 68* 76* 76*   BMET Recent Labs    01/16/20 0340 01/16/20 1624 01/17/20 0212  NA 133* 138 136  K 3.7 2.4* 3.1*  CL 90* 90* 91*  CO2 9* 18* 23  GLUCOSE 60* 180* 163*  BUN 7 6 9   CREATININE 1.22* 1.57* 2.09*  CALCIUM 8.4* 8.9 8.6*   LFT Recent Labs    01/16/20 0340 01/16/20 0340 01/17/20 0212  PROT  6.1*   < > 6.2*  ALBUMIN 2.0*   < > 2.1*  AST 841*   < > 2,242*  ALT 107*   < > 187*  ALKPHOS 147*   < > 170*  BILITOT 12.8*   < > 18.7*  BILIDIR 7.1*  --   --   IBILI 5.7*  --   --    < > = values in this interval not displayed.   PT/INR Recent Labs    01/16/20 0340 01/16/20 1624  LABPROT 64.9* 33.6*  INR 8.0* 3.4*   Hepatitis Panel Recent Labs    01/16/20 0813  HEPBSAG NON REACTIVE  HCVAB NON REACTIVE  HEPAIGM NON REACTIVE  HEPBIGM NON REACTIVE    Studies/Results: DG Chest 1 View  Result Date: February 02, 2020 CLINICAL DATA:  Altered level of consciousness EXAM: CHEST  1 VIEW COMPARISON:  None. FINDINGS: Cardiac silhouette is accentuated by low lung volumes and portable technique. No visible pleural effusions or pneumothorax. No acute osseous abnormality. IMPRESSION: No evidence of acute cardiopulmonary disease, although evaluation of the lung bases is limited by portable semi erect technique. Electronically Signed   By: 03/16/2020 MD   On: 02/02/20 20:06   CT HEAD WO CONTRAST  Result Date: 2020-02-02 CLINICAL DATA:  Mental status change, encephalopathy EXAM: CT HEAD WITHOUT CONTRAST TECHNIQUE: Contiguous axial images were obtained from the base of the skull through the vertex without intravenous contrast. COMPARISON:  CT head 04/02/2018 MRI 04/03/2018 FINDINGS: Brain: No  evidence of acute infarction, hemorrhage, hydrocephalus, extra-axial collection, visible mass lesion or mass effect. Basal cisterns are patent. Slight CSF expansion of the sella, similar to prior. Remaining midline intracranial structures are unremarkable. Cerebellar tonsils normally positioned. Vascular: No hyperdense vessel or unexpected calcification. Skull: No calvarial fracture or suspicious osseous lesion. No scalp swelling or hematoma. Remote appearing right orbital floor fracture though new since comparisons in 2019. Sinuses/Orbits: Minimal thickening in the ethmoid sinuses. Remaining paranasal  sinuses and mastoid air cells are predominantly clear. Right orbital floor fracture, as above. Included orbital contents are otherwise unremarkable. Other: None. IMPRESSION: 1. No acute intracranial abnormality. 2. Partially empty sella, similar to comparison. 3. Remote appearing right orbital floor fracture appears remote though is a new finding since comparisons in 2019. Electronically Signed   By: Kreg Shropshire M.D.   On: 07-Feb-2020 23:27   CT ABDOMEN PELVIS W CONTRAST  Result Date: 02-07-20 CLINICAL DATA:  Abdominal pain, acute, nonlocalized. Alcohol use, Guillain Barre. Icterus. EXAM: CT ABDOMEN AND PELVIS WITH CONTRAST TECHNIQUE: Multidetector CT imaging of the abdomen and pelvis was performed using the standard protocol following bolus administration of intravenous contrast. CONTRAST:  OMNIPAQUE IOHEXOL 300 MG/ML  SOLN COMPARISON:  None. FINDINGS: Lower chest: Peribronchovascular consolidative airspace opacities within the lingula as well as left lower lobe. Patchy ground-glass airspace opacities within the right lower lobe. Consolidative patchy airspace opacities within the right upper lobe and right middle lobe. Hepatobiliary: The liver is enlarged measuring up to 25.5 cm. Markedly diffuse Lea hypodense hepatic parenchyma compared to the spleen consistent with hepatic steatosis. No focal hepatic lesions; however, limited evaluation on this single venous contrast study. Prominent gallbladder wall which can be seen in chronic liver disease. No radiopaque gallstones identified. No biliary ductal dilatation. Pancreas: No focal lesion. Normal pancreatic contour. No surrounding inflammatory changes. No main pancreatic ductal dilatation. Spleen: Normal in size without focal abnormality. Adrenals/Urinary Tract: No adrenal nodule bilaterally. Bilateral kidneys enhance symmetrically. No hydronephrosis. No hydroureter. The urinary bladder is unremarkable. Stomach/Bowel: Stomach is within normal limits. The  appendix not definitely identified. No evidence of bowel wall thickening, distention, or inflammatory changes. Fluid noted within the lumen of the colon; the fluid is noted to measure 60-80 Hounsfield units in density. Vascular/Lymphatic: No abdominal aorta or iliac aneurysm. No abdominal, pelvic, or inguinal lymphadenopathy. the portal, splenic, superior mesenteric veins are patent. Reproductive: Suggestion of isodense pericentimeter soft tissue lesions within the right uterine wall suggestive of uterine fibroids no adnexal lesions. Other: No intraperitoneal free fluid. No intraperitoneal free gas. No organized fluid collection. Musculoskeletal: Mild subcutaneus soft tissue edema. No suspicious lytic or blastic osseous lesions. No acute displaced fracture. Multilevel degenerative changes of the spine. IMPRESSION: 1. Pulmonary findings suggestive of infection or inflammation (such as aspiration pneumonia). COVID-19 not excluded. Recommend short-term follow-up CT post treatment to exclude underlying lesion/malignancy. 2. Hepatomegaly and hepatic steatosis. 3. Fast state of transition/diarrhea with high density fluid within the lumen of the colon which may be due to PO contrast administration versus blood products. Correlate with physical exam and history. Electronically Signed   By: Tish Frederickson M.D.   On: 07-Feb-2020 23:55   DG CHEST PORT 1 VIEW  Result Date: 01/16/2020 CLINICAL DATA:  Follow-up intubation.  Altered mental status. EXAM: PORTABLE CHEST 1 VIEW COMPARISON:  Feb 07, 2020 FINDINGS: Endotracheal tube tip is at the carina. Consider withdrawal 1 or 2 cm. Orogastric or nasogastric tube enters the stomach. Left internal jugular central line tip in the proximal right atrium.  Withdraw 2 cm to be above the right atrium if desired. Newly seen/worsened infiltrate/atelectasis in the mid and lower lungs bilaterally. This could represent infectious pneumonia or aspiration. No pleural effusion. IMPRESSION: 1.  Endotracheal tube tip at the carina. Consider withdrawal 1 or 2 cm. 2. Newly seen/worsened infiltrate/atelectasis in the mid and lower lungs bilaterally. This could represent infectious pneumonia or aspiration. Electronically Signed   By: Paulina Fusi M.D.   On: 01/16/2020 10:18   DG Abd Portable 1V  Result Date: 01/16/2020 CLINICAL DATA:  Nasogastric tube placement EXAM: PORTABLE ABDOMEN - 1 VIEW COMPARISON:  None. FINDINGS: Tip and side port of the nasogastric tube project over the stomach. Nonobstructive bowel gas pattern. IMPRESSION: Nasogastric tube tip and side port in the stomach. Electronically Signed   By: Deatra Robinson M.D.   On: 01/16/2020 01:18   EEG adult  Result Date: 01/16/2020 Charlsie Quest, MD     01/16/2020  8:15 AM Patient Name: Kalise Fickett MRN: [id number] Epilepsy Attending: Charlsie Quest Referring Physician/Provider: Dr. Glyn Ade Date: 01/16/2020 Duration: 25.29 mins Patient history: 44 year old female with history of extensive alcohol abuse was presented with altered mental status for last several days.  EEG to evaluate for seizures. Level of alertness: Awake/ lethargic, asleep AEDs during EEG study: Ativan Technical aspects: This EEG study was done with scalp electrodes positioned according to the 10-20 International system of electrode placement. Electrical activity was acquired at a sampling rate of 500Hz  and reviewed with a high frequency filter of 70Hz  and a low frequency filter of 1Hz . EEG data were recorded continuously and digitally stored. Description: No posterior dominant rhythm was seen.  Sleep was characterized by sleep spindles (13 to 15 Hz), maximal frontocentral region.  EEG showed continuous generalized 3 to 6 Hz theta-delta slowing.  Hyperventilation and photic stimulation were not performed.   Of note, parts of EEG were difficult interpret due to significant electrode artifact. ABNORMALITY -Continuous slow, generalized IMPRESSION: This technically  difficult study suggestive of moderate diffuse encephalopathy, nonspecific to etiology. No seizures or epileptiform discharges were seen throughout the recording.      Assessment & Recommendations  44 year old female with history of alcohol abuse and recent alcohol use presenting with acute liver failure, UTI, probable aspiration pneumonitis/pneumonia  1.  Acute liver failure --remains critically ill, intubated and sedated.  Parameters suggest very poor prognosis.  Renal function is worsening.  Liver enzymes more remarkably elevated today and likely a component of shock liver.  AST up significantly more than ALT, question if there is an element of muscle damage/rhabdomyolysis.  Could check CK.  INR is better though somewhat artificially with recent FFP.  She is also receiving vitamin K. --Continue supportive care; with recent alcohol abuse she would not qualify for liver transplantation at this time --Follow INR, continue vitamin K x3 days --Continue lactulose for hepatic encephalopathy --Consider checking CK to evaluate muscle injury to explain some component of AST --Bilirubin is rising which is not unexpected --She is on hydrocortisone; for alcoholic hepatitis the equivalent of prednisone/prednisolone 40 mg is sufficient but will defer dosing to critical care medicine given critical illness --Very poor prognosis, MELD increasing, today = 38  2. Coffee ground emesis -- Hgb has dropped to 6.0 from 7.2, but 9.0 on presentation.  More than expected response to 1 u pRBC with increase to 8.8.  Coffee grounds may be due to OG tube trauma or bleeding portal gastropathy in the setting of INR which was 8  yesterday.  My suspicion for acute variceal hemorrhage is low given lack of red blood per OG, relative small volume coffee-ground emesis and lack of hematochezia/melena.  Also I would have expected more hemodynamic instability with acute significant GI bleeding, rather pressors are being  weaned. --Continue twice daily IV PPI --Follow-up hemoglobin, with plans to recheck in the next 1 to 2 hours --Hemoglobin transfusion threshold 7.0 --If more overt bleeding would start octreotide --I do not think she needs an upper endoscopy right now, but we will monitor  3.  UTI/aspiration pneumonitis --covered with piperacillin/tazobactam        LOS: 2 days   Beverley FiedlerJay M Alayne Estrella  01/17/2020, 12:37 PM

## 2020-01-17 NOTE — Progress Notes (Signed)
eLink Physician-Brief Progress Note Patient Name: Rebecca Goodman DOB: [birth year] MRN: [id number]   Date of Service  01/17/2020  HPI/Events of Note  Ventilator dyssynchrony related to sub-optimal sedation, patient has liver failure necessitating a delicate balance between sedation and the need to spare the liver.  eICU Interventions  Sedation cautiously adjusted upwards.        Thomasene Lot Suzzette Gasparro 01/17/2020, 9:37 PM

## 2020-01-17 NOTE — Progress Notes (Signed)
Patient was noted to have tube feed like secretions from mouth and nose. Tube feed was started 01/17/2020 with a rate of 65 mL/hr. OG tube placement verified. A residual was also checked and found to be 525 mL of tube feed. Patient not tolerating tube feed at this time. OG tube clamped. Will check CBG's to ensure no hypoglycemic events occur. Will continue to monitor closely. MD made aware. Will double check residual at midnight to determine if patient is absorbing tube feed and will reassess.   Barbaraann Cao, RN  01/17/2020 2120

## 2020-01-17 NOTE — Progress Notes (Signed)
CRITICAL VALUE ALERT  Critical Value:  Total Bilirubin 18.7  Date & Time Notied:  01/17/2020 0526  Provider Notified: E-Link notified.  Orders Received/Actions taken: Awaiting orders. MD made aware.   Barbaraann Cao, RN  01/17/[phone]

## 2020-01-17 NOTE — Progress Notes (Signed)
NAME:  Rebecca Goodman, MRN:  [id number], DOB:  [birth year], LOS: 2 ADMISSION DATE:  01/09/2020, CONSULTATION DATE: 10/12 REFERRING MD: Dr. Anitra Lauth EDP, CHIEF COMPLAINT: Altered mental status  Brief History   44 year old female with history of alcohol abuse presents with altered mental status times days.  In ED found to have hepatic failure with lactic acid greater than 11 and was lethargic.  PCCM asked to admit.  History of present illness   Patient is encephalopathic and/or intubated. Therefore history has been obtained from chart review. 44 year old female with past medical history as below, which is significant for DM Barr syndrome and alcohol abuse.  Per family reports she typically drinks quite heavily, but had been attempting to cut down on drinking and was down to 1-2 drinks per day.  EMS was dispatched on 10/12 in the late evening hours due to altered mental status times several days.  It got to the point she was quite delirious and was unable to recognize family members.  She had been found on the floor beside the bed more than once in the preceding 24 hours.  Upon arrival to the emergency department she was quite lethargic and and there was questionable seizure activity.  CBG reading was "low".  She was hypothermic with temperature 90.4 F.  Laboratory evaluation was significant for lactic acid greater than 11, ammonia 123, AST 480, ALT 100, total bilirubin 13.2, INR 5.1, and alcohol level undetectable.  Hypothermia raise concern for sepsis and she was started on empiric antibiotics.  CT imaging was unable to be done initially due to patient's ability to cooperate.  Gastroenterology was consulted for liver failure and PCCM was asked to admit.  Past Medical History   has a past medical history of Arthralgia of hip, Arthralgia of knee, Folate deficiency (04/05/2018), Guillain Barr syndrome (HCC), Macrocytic anemia (04/05/2018), and Obesity.   Significant Hospital Events   10/12 admit 10/13 >  intubated  Consults:  Gastroenterology  Procedures:  10/13 >> intubated 10/13 >> central line placed  Significant Diagnostic Tests:  CT head 10/12 > stable, partially empty sella. New finding of remote appearing R orbital floor fracture  CT abdomen pelvis 10/12 > Hepatomegaly, hepatic steatosis. High density fluid in the colon, question representing p.o. contrast vs blood products.  Bilateral airspace disease.   Micro Data:  Blood 10/12> Urine 10/12>  Antimicrobials:  Cefepime 10/12 > 10/12 Ceftriaxone 10/12 > 10/12 Vancomycin 10/12 > 10/13 Zosyn 10/13 >  Interim history/subjective:  Noted about 200-300cc of coffee ground emesis yesterday evening. Hgb dropped to 6 yesterday, transfused 1 unit pRBC. Hgb 8.8 this AM. Spoke with GI, Dr. Rhea Belton. Unlikely variceal bleed as it would produce more bleeding especially with supratherapeutic INR. Will recheck CBC in afternoon. If Hgb continuing to decrease, may consider endoscopy once INR <2.   K 2.4 yesterday, repleted. Hypokalemia likely multifactorial from diarrhea and bicarb infusion.  Low dose precedex and prn fentanyl started for vent sedation. Added prn versed.  Urine cultures showing pan-sensitive E coli. Already on zosyn. Will stop vanc as no concern for gram positive infection at this time.  Will consult nephrology for refractory AKI, may need to consider CRRT.   Objective   Blood pressure (!) 108/48, pulse 71, temperature (!) 100.4 F (38 C), resp. rate (!) 30, height 5\' 3"  (1.6 m), weight 87.4 kg, SpO2 91 %.    Vent Mode: PRVC FiO2 (%):  [60 %-70 %] 60 % Set Rate:  [30 bmp-35 bmp] 30 bmp Vt Set:  [  410 mL] 410 mL PEEP:  [5 cmH20] 5 cmH20 Plateau Pressure:  [20 cmH20-27 cmH20] 27 cmH20   Intake/Output Summary (Last 24 hours) at 01/17/2020 1212 Last data filed at 01/17/2020 1208 Gross per 24 hour  Intake 5824.63 ml  Output 988 ml  Net 4836.63 ml   Filed Weights   01/16/20 0500  Weight: 87.4 kg     Examination: General: critically ill-appearing middle-aged female, on mechanical ventilation, in NAD. Eyes: scleral icterus present bilaterally CV: tachycardic with regular rhythm, no m/r/g Lungs: bilaterally coarse breath sounds, on mech vent Abdomen: firm, moderately distended. +BS Extremities: 1+ pitting edema R>L in bilateral LE. Neuro: sedated, but agitated when aroused.   Resolved Hospital Problem list     Assessment & Plan:  Decompensated hepatic failure 2/2 alcohol use disorder Hepatic vs metabolic encephalopathy Coagulopathy Thrombocytopenia Macrocytic anemia, hx of folate deficiency Hypoglycemia Hx of alcohol abuse, not definitively known to have cirrhosis. CT abd/pel showing fatty liver disease and hepatomegaly. INR 3.4 today. Patient with very poor prognosis, will likely need liver transplant. - GI following, appreciate recs - continue lactulose and thiamine - versed prn for alcohol withdrawal and sedation - Vit K x 3 days (on day 2) - negative hepatitis panel - IgG 1492 - f/u serologies - DF score ~241, on day 2 of solucortef - liver enzymes continuing to uptrend - trend hepatic panel, PT/INR - monitor CBGs - given albumin   Acute Respiratory Failure 2/2 aspiration PNA - continue vent management, wean as tolerated - VAP bundle - on precedex infusion, prn fentanyl, prn versed - wean sedation as tolerated, goal RAAS 0 to -3 - lung protective ventilation - continue zosyn, monitor renal function   Septic Shock, multifactorial from UTI vs Aspiration PNA Mixed metabolic disorder with high anion gap metabolic acidosis, metabolic alkalosis, respiratory alkalosis - ABG with pH 7.50 / pCO2 32 / PO2 82 / HCO3 25 - continue zosyn - stopped vanc - lactic still >11, continue to trend - stopped bicarb infusion - given albumin - levophed drip and vasopressin drip for MAP >65, wean levophed as tolerated, but keep vasopressin on. - stress dose  solucortef for possible relative adrenal insufficiency in the setting of septic shock - trend ABGs and BMP - blood cx pending - urine cx showing pan-sensitive E coli   Upper GI Bleed likely 2/2 decompensated liver failure - changed protonix to BID - GI following, may need endoscopy if hemoglobin continuing to drop - will keep patient NPO for now in case patient requires endoscopy - transfuse pRBC for Hgb <7   AKI 2/2 hepatorenal syndrome vs septic shock. - Cr trending up, now 2.09 - monitor UOP and renal indices - will consult nephrology, may need to consider CRRT  E coli UTI - continue zosyn   At risk of alcohol withdrawal - CIWA protocol - Seizure precautions - Thiamine - Folic acid   Best practice:  Diet: NPO Pain/Anxiety/Delirium protocol (if indicated): precedex gtt, prn fentanyl, prn versed VAP protocol (if indicated): Y DVT prophylaxis: NA GI prophylaxis: PPI Glucose control: CBG monitoring Mobility: Bedrest Code Status: Full Family Communication: Family updated via phone Disposition: ICU  Labs   CBC: Recent Labs  Lab 01/20/2020 1909 02/03/2020 2250 01/16/20 0340 01/16/20 1624 01/17/20 0212  WBC 9.2  --  8.8 13.0* 15.0*  NEUTROABS 7.3  --   --   --   --   HGB 9.0* 7.8* 7.2* 6.0* 8.8*  HCT 28.8* 23.0* 22.9* 18.8* 26.4*  MCV 127.4*  --  127.2* 123.7* 108.2*  PLT 59*  --  68* 76* 76*    Basic Metabolic Panel: Recent Labs  Lab 01/18/2020 1909 01/25/2020 2250 01/16/20 0340 01/16/20 1624 01/17/20 0212  NA 131* 130* 133* 138 136  K 3.4* 3.1* 3.7 2.4* 3.1*  CL 86*  --  90* 90* 91*  CO2 9*  --  9* 18* 23  GLUCOSE <20*  --  60* 180* 163*  BUN <5*  --  7 6 9   CREATININE 1.10*  --  1.22* 1.57* 2.09*  CALCIUM 8.7*  --  8.4* 8.9 8.6*  MG  --   --  2.3  --   --   PHOS  --   --  4.1  --   --    GFR: Estimated Creatinine Clearance: 36 mL/min (A) (by C-G formula based on SCr of 2.09 mg/dL (H)). Recent Labs  Lab 01/26/2020 1904 01/08/2020 1909 01/05/2020 2221  01/09/2020 2224 01/16/20 0103 01/16/20 0340 01/16/20 1624 01/16/20 1627 01/17/20 0212  PROCALCITON  --   --  0.51  --   --  0.54  --   --  3.54  WBC  --  9.2  --   --   --  8.8 13.0*  --  15.0*  LATICACIDVEN   < >  --   --  >11.0* >11.0* >11.0*  --  >11*  --    < > = values in this interval not displayed.    Liver Function Tests: Recent Labs  Lab 01/09/2020 1909 01/16/20 0340 01/17/20 0212  AST 480* 841* 2,242*  ALT 100* 107* 187*  ALKPHOS 202* 147* 170*  BILITOT 13.2* 12.8* 18.7*  PROT 6.6 6.1* 6.2*  ALBUMIN 1.6* 2.0* 2.1*   Recent Labs  Lab 01/11/2020 2221  LIPASE 22   Recent Labs  Lab 01/29/2020 1904  AMMONIA 123*    ABG    Component Value Date/Time   PHART 7.505 (H) 01/17/2020 0830   PCO2ART 32.5 01/17/2020 0830   PO2ART 82.7 (L) 01/17/2020 0830   HCO3 25.4 01/17/2020 0830   TCO2 12 (L) 01/20/2020 2250   ACIDBASEDEF 6.3 (H) 01/16/2020 1630   O2SAT 96.4 01/17/2020 0830     Coagulation Profile: Recent Labs  Lab 01/18/2020 1909 01/16/20 0340 01/16/20 1624  INR 5.1* 8.0* 3.4*    Cardiac Enzymes: No results for input(s): CKTOTAL, CKMB, CKMBINDEX, TROPONINI in the last 168 hours.  HbA1C: No results found for: HGBA1C  CBG: Recent Labs  Lab 01/16/20 1941 01/16/20 2342 01/17/20 0332 01/17/20 0845 01/17/20 1151  GLUCAP 154* 127* 143* 152* 150*    Review of Systems:   Patient is encephalopathic and/or intubated. Therefore history has been obtained from chart review.  Patient answers all questions with "Okay"  Past Medical History  She,  has a past medical history of Arthralgia of hip, Arthralgia of knee, Folate deficiency (04/05/2018), Guillain Barr syndrome (HCC), Macrocytic anemia (04/05/2018), and Obesity.   Surgical History    Past Surgical History:  Procedure Laterality Date  . WISDOM TOOTH EXTRACTION       Social History   reports that she has been smoking cigarettes. She has been smoking about 0.50 packs per day. She has never used  smokeless tobacco. She reports current alcohol use of about 2.0 standard drinks of alcohol per week. She reports that she does not use drugs.   Family History   Her family history includes Diabetes in her maternal aunt, maternal aunt, maternal grandmother, and  mother; Hypertension in her father; Stroke (age of onset: 40) in her father. There is no history of Sudden death, Heart attack, Hyperlipidemia, Cancer, or Heart disease.   Allergies Allergies  Allergen Reactions  . Prednisone Other (See Comments)    IM injection caused severe anxiety, felt like heart attack     Home Medications  Prior to Admission medications   Medication Sig Start Date End Date Taking? Authorizing Provider  folic acid (FOLVITE) 1 MG tablet Take 1 tablet (1 mg total) by mouth daily. 04/08/18   Standley Brooking, MD  gabapentin (NEURONTIN) 300 MG capsule Take 2 capsules (600 mg total) by mouth 3 (three) times daily. 04/25/18   Storm Frisk, MD  ibuprofen (ADVIL,MOTRIN) 800 MG tablet Take 1 tablet (800 mg total) by mouth every 8 (eight) hours as needed. 04/25/18   Storm Frisk, MD  Lidocaine-Aloe Vera (ALOE VERA/LIDOCAINE EX) Apply 1 application topically daily as needed (hand pain).    [provider]  oxyCODONE (OXY IR/ROXICODONE) 5 MG immediate release tablet Take 1-2 tablets (5-10 mg total) by mouth every 6 (six) hours as needed (take 1 tab for moderate pain, take 2 tabs for severe pain). 04/07/18   Standley Brooking, MD     Critical care time: 46 minutes      Merrilyn Puma, MD 01/17/2020, 12:12 PM

## 2020-01-17 NOTE — Progress Notes (Signed)
eLink Physician-Brief Progress Note Patient Name: Jessa Stinson DOB: 09-16-1975 MRN: [id number]   Date of Service  01/17/2020  HPI/Events of Note  K+ 3.1  eICU Interventions  Adult electrolyte replacement protocol for K+ ordered.        Thomasene Lot Kymber Kosar 01/17/2020, 6:11 AM

## 2020-01-17 NOTE — Progress Notes (Signed)
Initial Nutrition Assessment  DOCUMENTATION CODES:   Not applicable  INTERVENTION:   Tube feeding:  -Vital AF 1.2 @ 20 ml/hr via OG -Increase by 10 ml Q4 hours to goal rate of 65 ml/hr (1560 ml)  Provides: 1872 kcals, 117 grams protein, 1265 ml free water.   NUTRITION DIAGNOSIS:   Increased nutrient needs related to acute illness as evidenced by estimated needs.  GOAL:   Patient will meet greater than or equal to 90% of their needs  MONITOR:   Diet advancement, Vent status, Skin, TF tolerance, Weight trends, Labs, I & O's  REASON FOR ASSESSMENT:   Ventilator    ASSESSMENT:   Patient with PMH significant for GBS, DM, and ETOH use. Presents this admission with acute metabolic encephalopathy and hepatic failure.   Requiring pressors and low dose precedex. AKI worsening, may require CRRT. Pt with 200-300 ml coffee ground emesis last night. Per GI, likely result of OG placement trauma vs variceal hemorrhage. Xray confirmed OG in stomach. Will discuss feeding plan with CCM.   Weight history limited over the last year. Utilize 87.4 kg as EDW for now.   Patient is currently intubated on ventilator support MV: 12.2 L/min Temp (24hrs), Avg:99.1 F (37.3 C), Min:95.9 F (35.5 C), Max:100.9 F (38.3 C)   UOP: 15 ml x 24 hrs  Stool: 750 ml x 24 hrs   Drips: precedex, levophed, vasopressin Medications: folic acid, solumedrol, lactulose, thiamine Labs: K 3.1 (L) Cr 2.09-up from yesterday LFTs elevated CBG 60-180  Diet Order:   Diet Order            Diet NPO time specified  Diet effective now                 EDUCATION NEEDS:   Not appropriate for education at this time  Skin:  Skin Assessment: Skin Integrity Issues: Skin Integrity Issues:: Other (Comment) Other: MASD- vagina, anus  Last BM:  10/13 via rectal tube  Height:   Ht Readings from Last 1 Encounters:  01/16/20 5\' 3"  (1.6 m)    Weight:   Wt Readings from Last 1 Encounters:  01/16/20 87.4 kg     BMI:  Body mass index is 34.13 kg/m.  Estimated Nutritional Needs:   Kcal:  1814 kcal  Protein:  105-125 g  Fluid:  >/= 1.8 L/day   01/18/20 RD, LDN Clinical Nutrition Pager listed in [facility]ION

## 2020-01-18 ENCOUNTER — Inpatient Hospital Stay (HOSPITAL_COMMUNITY): Payer: Self-pay

## 2020-01-18 DIAGNOSIS — Z0189 Encounter for other specified special examinations: Secondary | ICD-10-CM

## 2020-01-18 DIAGNOSIS — K7682 Hepatic encephalopathy: Secondary | ICD-10-CM

## 2020-01-18 DIAGNOSIS — Z4659 Encounter for fitting and adjustment of other gastrointestinal appliance and device: Secondary | ICD-10-CM

## 2020-01-18 DIAGNOSIS — K7290 Hepatic failure, unspecified without coma: Secondary | ICD-10-CM

## 2020-01-18 LAB — BASIC METABOLIC PANEL
Anion gap: 20 — ABNORMAL HIGH (ref 5–15)
BUN: 17 mg/dL (ref 6–20)
CO2: 25 mmol/L (ref 22–32)
Calcium: 9.4 mg/dL (ref 8.9–10.3)
Chloride: 93 mmol/L — ABNORMAL LOW (ref 98–111)
Creatinine, Ser: 2.88 mg/dL — ABNORMAL HIGH (ref 0.44–1.00)
GFR, Estimated: 19 mL/min — ABNORMAL LOW (ref >60)
Glucose, Bld: 160 mg/dL — ABNORMAL HIGH (ref 70–99)
Potassium: 3.4 mmol/L — ABNORMAL LOW (ref 3.5–5.1)
Sodium: 138 mmol/L (ref 135–145)

## 2020-01-18 LAB — URINE CULTURE: Culture: >=100000 — AB

## 2020-01-18 LAB — PHOSPHORUS
Phosphorus: 2.5 mg/dL (ref 2.5–4.6)
Phosphorus: 2.7 mg/dL (ref 2.5–4.6)

## 2020-01-18 LAB — POCT I-STAT 7, (LYTES, BLD GAS, ICA,H+H)
Acid-Base Excess: 8 mmol/L — ABNORMAL HIGH (ref 0.0–2.0)
Bicarbonate: 30.8 mmol/L — ABNORMAL HIGH (ref 20.0–28.0)
Calcium, Ion: 1.12 mmol/L — ABNORMAL LOW (ref 1.15–1.40)
HCT: 28 % — ABNORMAL LOW (ref 36.0–46.0)
Hemoglobin: 9.5 g/dL — ABNORMAL LOW (ref 12.0–15.0)
O2 Saturation: 89 %
Patient temperature: 37.2
Potassium: 3.5 mmol/L (ref 3.5–5.1)
Sodium: 138 mmol/L (ref 135–145)
TCO2: 32 mmol/L (ref 22–32)
pCO2 arterial: 34.3 mmHg (ref 32.0–48.0)
pH, Arterial: 7.562 — ABNORMAL HIGH (ref 7.350–7.450)
pO2, Arterial: 49 mmHg — ABNORMAL LOW (ref 83.0–108.0)

## 2020-01-18 LAB — GLUCOSE, CAPILLARY
Glucose-Capillary: 148 mg/dL — ABNORMAL HIGH (ref 70–99)
Glucose-Capillary: 153 mg/dL — ABNORMAL HIGH (ref 70–99)
Glucose-Capillary: 140 mg/dL — ABNORMAL HIGH (ref 70–99)
Glucose-Capillary: 150 mg/dL — ABNORMAL HIGH (ref 70–99)
Glucose-Capillary: 104 mg/dL — ABNORMAL HIGH (ref 70–99)

## 2020-01-18 LAB — BLOOD GAS, ARTERIAL
Acid-Base Excess: 5.3 mmol/L — ABNORMAL HIGH (ref 0.0–2.0)
Acid-Base Excess: 2.3 mmol/L — ABNORMAL HIGH (ref 0.0–2.0)
Bicarbonate: 29.4 mmol/L — ABNORMAL HIGH (ref 20.0–28.0)
Bicarbonate: 26.9 mmol/L (ref 20.0–28.0)
FIO2: 100
FIO2: 100
O2 Saturation: 94.5 %
O2 Saturation: 88.3 %
Patient temperature: 37
Patient temperature: 37
pCO2 arterial: 43.6 mmHg (ref 32.0–48.0)
pCO2 arterial: 45.4 mmHg (ref 32.0–48.0)
pH, Arterial: 7.443 (ref 7.350–7.450)
pH, Arterial: 7.389 (ref 7.350–7.450)
pO2, Arterial: 77.7 mmHg — ABNORMAL LOW (ref 83.0–108.0)
pO2, Arterial: 60.7 mmHg — ABNORMAL LOW (ref 83.0–108.0)

## 2020-01-18 LAB — CBC
HCT: 26.8 % — ABNORMAL LOW (ref 36.0–46.0)
Hemoglobin: 8.8 g/dL — ABNORMAL LOW (ref 12.0–15.0)
MCH: 35.2 pg — ABNORMAL HIGH (ref 26.0–34.0)
MCHC: 32.8 g/dL (ref 30.0–36.0)
MCV: 107.2 fL — ABNORMAL HIGH (ref 80.0–100.0)
Platelets: 46 10*3/uL — ABNORMAL LOW (ref 150–400)
RBC: 2.5 MIL/uL — ABNORMAL LOW (ref 3.87–5.11)
RDW: 25 % — ABNORMAL HIGH (ref 11.5–15.5)
WBC: 12.7 10*3/uL — ABNORMAL HIGH (ref 4.0–10.5)
nRBC: 1.6 % — ABNORMAL HIGH (ref 0.0–0.2)

## 2020-01-18 LAB — MAGNESIUM
Magnesium: 2.2 mg/dL (ref 1.7–2.4)
Magnesium: 2.3 mg/dL (ref 1.7–2.4)

## 2020-01-18 LAB — LACTIC ACID, PLASMA
Lactic Acid, Venous: 4.7 mmol/L (ref 0.5–1.9)
Lactic Acid, Venous: 7.6 mmol/L (ref 0.5–1.9)

## 2020-01-18 MED ORDER — SODIUM CHLORIDE 0.9 % IV BOLUS
1000.0000 mL | Freq: Once | INTRAVENOUS | Status: AC
  Administered 2020-01-18: 1000 mL via INTRAVENOUS

## 2020-01-18 MED ORDER — HEPARIN SODIUM (PORCINE) 5000 UNIT/ML IJ SOLN
5000.0000 [IU] | Freq: Three times a day (TID) | INTRAMUSCULAR | Status: DC
  Administered 2020-01-18 – 2020-01-19 (×3): 5000 [IU] via SUBCUTANEOUS
  Filled 2020-01-18 (×3): qty 1

## 2020-01-18 MED ORDER — POTASSIUM CHLORIDE 20 MEQ/15ML (10%) PO SOLN
40.0000 meq | Freq: Once | ORAL | Status: AC
  Administered 2020-01-18: 40 meq
  Filled 2020-01-18: qty 30

## 2020-01-18 MED ORDER — SODIUM CHLORIDE 0.9 % IV SOLN
0.5000 mg/kg/h | INTRAVENOUS | Status: DC
  Administered 2020-01-18 – 2020-01-19 (×2): 0.5 mg/kg/h via INTRAVENOUS
  Filled 2020-01-18 (×2): qty 5

## 2020-01-18 NOTE — Progress Notes (Signed)
eLink Physician-Brief Progress Note Patient Name: Rebecca Goodman DOB: [birth year] MRN: [id number]   Date of Service  01/18/2020  HPI/Events of Note  K+ 3.4  eICU Interventions  KCL  40 meq via NG tube x 1.        [name] 01/18/2020, 5:36 AM

## 2020-01-18 NOTE — Progress Notes (Signed)
Daily Rounding Note  01/18/2020, 11:51 AM  LOS: 3 days   SUBJECTIVE:   Chief complaint: Acute liver failure    Oliguric renal failure continues/worsening.. Remains on norepinephrine, vasopressin. Appeared to regurgitate tube feeds last night when the rate was 65 mL/hour.  Gastric residuals measuring about 600 mL.  Tube feed rate reduced to 20 mL/h and currently tolerating this.  Not much stool into the Flexi-Seal.  What is visible in the tubing is dark.  Remains critically ill with oliguric renal failure, vent dependency   OBJECTIVE:         Vital signs in last 24 hours:    Temp:  [97.9 F (36.6 C)-101.1 F (38.4 C)] 98.4 F (36.9 C) (10/15 1144) Pulse Rate:  [56-75] 59 (10/15 1100) Resp:  [27-32] 30 (10/15 1100) SpO2:  [87 %-100 %] 95 % (10/15 1111) Arterial Line BP: (80-144)/(40-69) 118/60 (10/15 1100) FiO2 (%):  [60 %-100 %] 100 % (10/15 1111) Last BM Date: 01/17/20 Filed Weights   01/16/20 0500  Weight: 87.4 kg   General: Ill with jaundice, scleral icterus.  Sedated and unresponsive on vent. Heart: RRR. Chest: No labored breathing on the vent.  Coarse breath sounds bilaterally. Abdomen: Tight, protuberant, bowel sounds scant but no tinkling or tympanic bowel sounds Extremities: 2+ lower extremity edema. Neuro/Psych: Sedated.  Intake/Output from previous day: 10/14 0701 - 10/15 0700 In: 1409.9 [I.V.:797.1; NG/GT:370; IV Piggyback:242.9] Out: 798 [Urine:223; Emesis/NG output:525; Stool:50]  Intake/Output this shift: Total I/O In: 10 [I.V.:10] Out: -   Lab Results: Recent Labs    01/17/20 0212 01/17/20 0212 01/17/20 1306 01/18/20 0042 01/18/20 0404  WBC 15.0*  --  12.5*  --  12.7*  HGB 8.8*   < > 8.6* 9.5* 8.8*  HCT 26.4*   < > 26.3* 28.0* 26.8*  PLT 76*  --  60*  --  46*   < > = values in this interval not displayed.   BMET Recent Labs    01/16/20 1624 01/16/20 1624 01/17/20 0212  01/18/20 0042 01/18/20 0404  NA 138   < > 136 138 138  K 2.4*   < > 3.1* 3.5 3.4*  CL 90*  --  91*  --  93*  CO2 18*  --  23  --  25  GLUCOSE 180*  --  163*  --  160*  BUN 6  --  9  --  17  CREATININE 1.57*  --  2.09*  --  2.88*  CALCIUM 8.9  --  8.6*  --  9.4   < > = values in this interval not displayed.   LFT Recent Labs    February 03, 2020 1909 01/16/20 0340 01/17/20 0212  PROT 6.6 6.1* 6.2*  ALBUMIN 1.6* 2.0* 2.1*  AST 480* 841* 2,242*  ALT 100* 107* 187*  ALKPHOS 202* 147* 170*  BILITOT 13.2* 12.8* 18.7*  BILIDIR 7.0* 7.1*  --   IBILI 6.2* 5.7*  --    PT/INR Recent Labs    01/16/20 0340 01/16/20 1624  LABPROT 64.9* 33.6*  INR 8.0* 3.4*   Hepatitis Panel Recent Labs    01/16/20 0813  HEPBSAG NON REACTIVE  HCVAB NON REACTIVE  HEPAIGM NON REACTIVE  HEPBIGM NON REACTIVE    Studies/Results: DG CHEST PORT 1 VIEW  Result Date: 01/18/2020 CLINICAL DATA:  44 year old female with history of respiratory failure. EXAM: PORTABLE CHEST 1 VIEW COMPARISON:  Chest x-ray 01/16/2020. FINDINGS: Patient remains intubated, with the tip  of the endotracheal tube within 4 mm of the carina. There is a left-sided internal jugular central venous catheter with tip terminating in the right atrium. A nasogastric tube is seen extending into the stomach, however, the tip of the nasogastric tube extends below the lower margin of the image. Lung volumes remain low. Worsening aeration with patchy airspace disease and areas of interstitial prominence most evident throughout the mid to lower lungs bilaterally, concerning for multilobar pneumonia. No definite pleural effusions. No evidence of pulmonary edema. No pneumothorax. Heart size is normal. Upper mediastinal contours are within normal limits. IMPRESSION: 1. Support apparatus, as above. Please take note of the persistent low lying endotracheal tube. Withdrawal of the tube 3 cm for more optimal placement is recommended. 2. Low lung volumes with  worsening aeration throughout the mid to lower lungs bilaterally, concerning for progressive multilobar bilateral pneumonia. Electronically Signed   By: Trudie Reed M.D.   On: 01/18/2020 09:59    ASSESMENT:   *   Acute alcoholic liver disease with decompensation/failure. Day 3 hydrocortisone (prednisolone substitute)  *    Oliguric renal failure. Worsening. Renal following. Poor candidate for dialysis. No immediate plans for dialysis or CRRT currently. Is good to know  *    Coagulopathy. Day 3/3 vitamin K. INR 8 >> 3.4  *    Aspiration pneumonia, acute respiratory failure, intubated/ventilated. Now weaned off Precedex but remains on fentanyl drip. Zosyn in place. Today's portable chest films show concern for progressive multi lobar, bilateral pneumonia.  *    Hypokalemia, persists but improved.  *    Macrocytic anemia.  *    Thrombocytopenia, progressive.  *   Abdominal distention.  Wonder if this is affecting her ability to tolerate tube feeds? No ascites per CT scan of 02/05/2020. Nonobstructive bowel gas pattern per portable KUB 01/16/2020. Marland Kitchen   PLAN   *   Recheck LFTs in the morning. Regardless of whether they are up or down today will make any difference in her management Check Lille score 10/19, day 7 of steroids.    *   Does she need an ultrasound to assess for ascites?       Jennye Moccasin  01/18/2020, 11:51 AM Phone [phone]

## 2020-01-18 NOTE — Progress Notes (Addendum)
eLink Physician-Brief Progress Note Patient Name: Rebecca Goodman DOB: 05/07/1975 MRN: [id number]   Date of Service  01/18/2020  HPI/Events of Note  Mulltiiple issues: 1. CXR reveals ETT 1 cm above the carina. 2. Hypoxia - Sat = 75%, 3. CXR also reveals RLL/RML infiltate and huge gastric air bubble compressing L lung base and 4. Lactic Acid = 7.6.   eICU Interventions  Plan: 1. Pull ETT back 1 cm.  2. Repeat CXR in AM already ordered.  3. Increase PEEP to 10.  4. NGT to LIS.  5. After NGT to LIS. May try L lateral decubitus position to improve sats.  6. Bolus with 0.9 NaCl 1 liter IV over 1 hour now.  7. Will ask ground team to assess patient at bedside.      Intervention Category Major Interventions: Other:  [name] 01/18/2020, 9:10 PM

## 2020-01-18 NOTE — Progress Notes (Signed)
Gastric residual re-checked with a finding of < 20 mL. MD made aware. OGT placement verified. Will restart TF at a rate of 20 mL/hr via OGT. Will continue to monitor closely for signs of intolerance and increase as able  to reach goal rate per nutrition note.   Barbaraann Cao, RN  01/18/2020 0430

## 2020-01-18 NOTE — Progress Notes (Addendum)
NAME:  Rebecca Goodman, MRN:  [id number], DOB:  07/08/1975, LOS: 3 ADMISSION DATE:  01/23/2020, CONSULTATION DATE: 10/12 REFERRING MD: Dr. Anitra Lauth EDP, CHIEF COMPLAINT: Altered mental status  Brief History   44 year old female with history of alcohol abuse presents with altered mental status times days.  In ED found to have hepatic failure with lactic acid greater than 11 and was lethargic.  PCCM asked to admit.  History of present illness   Patient is encephalopathic and/or intubated. Therefore history has been obtained from chart review. 44 year old female with past medical history as below, which is significant for DM Barr syndrome and alcohol abuse.  Per family reports she typically drinks quite heavily, but had been attempting to cut down on drinking and was down to 1-2 drinks per day.  EMS was dispatched on 10/12 in the late evening hours due to altered mental status times several days.  It got to the point she was quite delirious and was unable to recognize family members.  She had been found on the floor beside the bed more than once in the preceding 24 hours.  Upon arrival to the emergency department she was quite lethargic and and there was questionable seizure activity.  CBG reading was "low".  She was hypothermic with temperature 90.4 F.  Laboratory evaluation was significant for lactic acid greater than 11, ammonia 123, AST 480, ALT 100, total bilirubin 13.2, INR 5.1, and alcohol level undetectable.  Hypothermia raise concern for sepsis and she was started on empiric antibiotics.  CT imaging was unable to be done initially due to patient's ability to cooperate.  Gastroenterology was consulted for liver failure and PCCM was asked to admit.  Past Medical History   has a past medical history of Arthralgia of hip, Arthralgia of knee, Folate deficiency (04/05/2018), Guillain Barr syndrome (HCC), Macrocytic anemia (04/05/2018), and Obesity.   Significant Hospital Events   10/12 admit 10/13 >  intubated  Consults:  Gastroenterology  Procedures:  10/13 >> intubated 10/13 >> central line placed  Significant Diagnostic Tests:  CT head 10/12 > stable, partially empty sella. New finding of remote appearing R orbital floor fracture  CT abdomen pelvis 10/12 > Hepatomegaly, hepatic steatosis. High density fluid in the colon, question representing p.o. contrast vs blood products.  Bilateral airspace disease.   Micro Data:  Blood 10/12> Urine 10/12>  Antimicrobials:  Cefepime 10/12 > 10/12 Ceftriaxone 10/12 > 10/12 Vancomycin 10/12 > 10/13 Zosyn 10/13 >  Interim history/subjective:  Emesis O/N, not coffee ground or frank blood noticed. Tube feeds temporarily stopped, re-started this AM.  Objective   Blood pressure (!) 108/48, pulse (!) 57, temperature 98.2 F (36.8 C), resp. rate (!) 32, height 5\' 3"  (1.6 m), weight 87.4 kg, SpO2 94 %.    Vent Mode: PRVC FiO2 (%):  [60 %-100 %] 100 % Set Rate:  [30 bmp] 30 bmp Vt Set:  [410 mL] 410 mL PEEP:  [5 cmH20] 5 cmH20 Plateau Pressure:  [27 cmH20-35 cmH20] 35 cmH20   Intake/Output Summary (Last 24 hours) at 01/18/2020 0810 Last data filed at 01/18/2020 0700 Gross per 24 hour  Intake 1257.73 ml  Output 798 ml  Net 459.73 ml   Filed Weights   01/16/20 0500  Weight: 87.4 kg    Examination: General: Ill-appearing, no acute distress Eyes: Scleral icterus present bilaterally CV: Tachycardic, regular rhythm. No m/r/g Lungs: Coarse breath sounds bilaterally Abdomen: Distended, normoactive bowel sounds Extremities: 1+ pitting edema R>L in bilateral LE. Neuro: Intubated, sedated  Resolved Hospital Problem list     Assessment & Plan:  #Decompensated hepatic failure 2/2 alcohol use disorder #Hepatic vs metabolic encephalopathy Hx of alcohol abuse, not definitively known to have cirrhosis. CT abd/pel showing fatty liver disease and hepatomegaly. INR 3.4 today. Patient with very poor prognosis, will likely need liver  transplant. - GI following, appreciate recs - C/w lactulose and thiamine - C/w Precedex gtt - Vit K (day 3/3) - Negative hepatitis panel - Day 3 hydrocortisone - trend hepatic panel, PT/INR - S/p albumin  #Acute Respiratory Failure 2/2 aspiration PNA - continue vent management, wean as tolerated - VAP bundle - Weaned off Precedex, c/w fentanyl - wean sedation as tolerated, goal RAAS 0 to -3 - continue zosyn, monitor renal function  #Septic Shock, multifactorial from UTI vs Aspiration PNA #Mixed metabolic disorder with high anion gap metabolic acidosis, metabolic alkalosis, respiratory alkalosis - ABG with pH 7.56 / pCO2 34 / PO2 49 / HCO3 25 - C/w zosyn (day 3) - Lactic downtrending - s/p bicarb - Current pressor needs: Levo 3, Vaso 0.03 - BCx NGTD  - UCx E. Coli, pan-sensitive  #Upper GI Bleed likely 2/2 decompensated liver failure - C/w protonix - Hgb stable, will monitor need for endoscopy, GI following - transfuse pRBC for Hgb <7  #Acute Kidney Injury - Nephrology following, possibly ATN - CK elevated - Minimal UOP  #At risk of alcohol withdrawal - CIWA protocol - Seizure precautions - Thiamine - Folic acid  Best practice:  Diet: NPO Pain/Anxiety/Delirium protocol (if indicated): precedex gtt, prn fentanyl, prn versed VAP protocol (if indicated): Y DVT prophylaxis: NA GI prophylaxis: PPI Glucose control: CBG monitoring Mobility: Bedrest Code Status: Full Family Communication: Family updated via phone Disposition: ICU  Labs   CBC: Recent Labs  Lab 01-28-2020 1909 Jan 28, 2020 2250 01/16/20 0340 01/16/20 0340 01/16/20 1624 01/17/20 0212 01/17/20 1306 01/18/20 0042 01/18/20 0404  WBC 9.2   < > 8.8  --  13.0* 15.0* 12.5*  --  12.7*  NEUTROABS 7.3  --   --   --   --   --   --   --   --   HGB 9.0*   < > 7.2*   < > 6.0* 8.8* 8.6* 9.5* 8.8*  HCT 28.8*   < > 22.9*   < > 18.8* 26.4* 26.3* 28.0* 26.8*  MCV 127.4*   < > 127.2*  --  123.7* 108.2* 107.3*   --  107.2*  PLT 59*   < > 68*  --  76* 76* 60*  --  46*   < > = values in this interval not displayed.    Basic Metabolic Panel: Recent Labs  Lab 01-28-2020 1909 January 28, 2020 2250 01/16/20 0340 01/16/20 1624 01/17/20 0212 01/17/20 1650 01/18/20 0042 01/18/20 0404  NA 131*   < > 133* 138 136  --  138 138  K 3.4*   < > 3.7 2.4* 3.1*  --  3.5 3.4*  CL 86*  --  90* 90* 91*  --   --  93*  CO2 9*  --  9* 18* 23  --   --  25  GLUCOSE <20*  --  60* 180* 163*  --   --  160*  BUN <5*  --  7 6 9   --   --  17  CREATININE 1.10*  --  1.22* 1.57* 2.09*  --   --  2.88*  CALCIUM 8.7*  --  8.4* 8.9 8.6*  --   --  9.4  MG  --   --  2.3  --   --  2.3  --  2.2  PHOS  --   --  4.1  --   --  2.1*  --  2.5   < > = values in this interval not displayed.   GFR: Estimated Creatinine Clearance: 26.1 mL/min (A) (by C-G formula based on SCr of 2.88 mg/dL (H)). Recent Labs  Lab Mar 22, 2020 2221 Mar 22, 2020 2224 01/16/20 0340 01/16/20 0340 01/16/20 1624 01/16/20 1627 01/17/20 0212 01/17/20 1306 01/18/20 0404 01/18/20 0535  PROCALCITON 0.51  --  0.54  --   --   --  3.54  --   --   --   WBC  --    < > 8.8   < > 13.0*  --  15.0* 12.5* 12.7*  --   LATICACIDVEN  --    < > >11.0*  --   --  >11*  --  7.8*  --  4.7*   < > = values in this interval not displayed.    Liver Function Tests: Recent Labs  Lab Mar 22, 2020 1909 01/16/20 0340 01/17/20 0212  AST 480* 841* 2,242*  ALT 100* 107* 187*  ALKPHOS 202* 147* 170*  BILITOT 13.2* 12.8* 18.7*  PROT 6.6 6.1* 6.2*  ALBUMIN 1.6* 2.0* 2.1*   Recent Labs  Lab Mar 22, 2020 2221  LIPASE 22   Recent Labs  Lab Mar 22, 2020 1904  AMMONIA 123*    ABG    Component Value Date/Time   PHART 7.562 (H) 01/18/2020 0042   PCO2ART 34.3 01/18/2020 0042   PO2ART 49 (L) 01/18/2020 0042   HCO3 30.8 (H) 01/18/2020 0042   TCO2 32 01/18/2020 0042   ACIDBASEDEF 6.3 (H) 01/16/2020 1630   O2SAT 89.0 01/18/2020 0042     Coagulation Profile: Recent Labs  Lab Mar 22, 2020 1909  01/16/20 0340 01/16/20 1624  INR 5.1* 8.0* 3.4*    Cardiac Enzymes: Recent Labs  Lab 01/17/20 1449  CKTOTAL 588*    HbA1C: No results found for: HGBA1C  CBG: Recent Labs  Lab 01/17/20 1619 01/17/20 1941 01/17/20 2337 01/18/20 0346 01/18/20 0754  GLUCAP 146* 139* 146* 148* 153*    Review of Systems:   Patient is encephalopathic and/or intubated. Therefore history has been obtained from chart review.  Patient answers all questions with "Okay"  Past Medical History  She,  has a past medical history of Arthralgia of hip, Arthralgia of knee, Folate deficiency (04/05/2018), Guillain Barr syndrome (HCC), Macrocytic anemia (04/05/2018), and Obesity.   Surgical History    Past Surgical History:  Procedure Laterality Date  . WISDOM TOOTH EXTRACTION       Social History   reports that she has been smoking cigarettes. She has been smoking about 0.50 packs per day. She has never used smokeless tobacco. She reports current alcohol use of about 2.0 standard drinks of alcohol per week. She reports that she does not use drugs.   Family History   Her family history includes Diabetes in her maternal aunt, maternal aunt, maternal grandmother, and mother; Hypertension in her father; Stroke (age of onset: 761) in her father. There is no history of Sudden death, Heart attack, Hyperlipidemia, Cancer, or Heart disease.   Allergies Allergies  Allergen Reactions  . Prednisone Other (See Comments)    IM injection caused severe anxiety, felt like heart attack     Home Medications  Prior to Admission medications   Medication Sig Start Date End Date Taking? Authorizing Provider  folic acid (FOLVITE) 1 MG tablet Take 1 tablet (1 mg total) by mouth daily. 04/08/18   Standley Brooking, MD  gabapentin (NEURONTIN) 300 MG capsule Take 2 capsules (600 mg total) by mouth 3 (three) times daily. 04/25/18   Storm Frisk, MD  ibuprofen (ADVIL,MOTRIN) 800 MG tablet Take 1 tablet (800 mg total) by mouth  every 8 (eight) hours as needed. 04/25/18   Storm Frisk, MD  Lidocaine-Aloe Vera (ALOE VERA/LIDOCAINE EX) Apply 1 application topically daily as needed (hand pain).    [provider]  oxyCODONE (OXY IR/ROXICODONE) 5 MG immediate release tablet Take 1-2 tablets (5-10 mg total) by mouth every 6 (six) hours as needed (take 1 tab for moderate pain, take 2 tabs for severe pain). 04/07/18   Standley Brooking, MD     Critical care time: 46 minutes      Evlyn Kanner, MD 01/18/2020, 8:10 AM

## 2020-01-18 NOTE — Progress Notes (Signed)
ET tube pulled back 2 cm per MD CXR will be ordered.

## 2020-01-18 NOTE — Progress Notes (Signed)
eLink Physician-Brief Progress Note Patient Name: Rebecca Goodman DOB: [birth year] MRN: [id number]   Date of Service  01/18/2020  HPI/Events of Note  CXR from 9:54 AM reveals the ETT tip to be 3-4 mm above carina. There is no note in Epic that this was addressed by the day rounding team.  eICU Interventions  Plan: 1. Withdraw ETT 2 cm and repeat portable CXR.     Intervention Category Major Interventions: Other:  [name] 01/18/2020, 7:42 PM

## 2020-01-18 NOTE — Progress Notes (Signed)
Critical CARE brief note.  Called to the bedside by Elink.  Patient had become progressively hypoxic through the day.  Endotracheal tube is being adjusted.  Chest x-ray demonstrated infiltrate bilateral but mostly of the right lower and middle lobes.  With a concomitant pleural effusion.  Recruitment maneuver was performed with significant improvement of oxygenation.  I time was lengthened and respiratory rate was dropped to 28.  Patient needs further sedation but due to her hepatorenal issues and ketamine infusion may be the best option.  Orders entered.

## 2020-01-18 NOTE — Progress Notes (Signed)
Admit: 01/20/2020 LOS: 3  44F acute liver failure/alcoholic hepatitis; shock; AKI  Subjective:  . Remains on norepinephrine and vasopressin . 0.2 L urine output . Creatinine worsened to 2.9, HCO3 25, K3.4  10/14 0701 - 10/15 0700 In: 1409.9 [I.V.:797.1; NG/GT:370; IV Piggyback:242.9] Out: 798 [Urine:223; Emesis/NG output:525; Stool:50]  Filed Weights   01/16/20 0500  Weight: 87.4 kg    Scheduled Meds: . sodium chloride   Intravenous Once  . chlorhexidine gluconate (MEDLINE KIT)  15 mL Mouth Rinse BID  . Chlorhexidine Gluconate Cloth  6 each Topical Daily  . folic acid  1 mg Per Tube Daily  . hydrocortisone sod succinate (SOLU-CORTEF) inj  50 mg Intravenous Q6H  . lactulose  30 g Per Tube TID  . mouth rinse  15 mL Mouth Rinse 10 times per day  . multivitamin with minerals  1 tablet Per Tube Daily  . pantoprazole (PROTONIX) IV  40 mg Intravenous BID  . sodium chloride flush  10-40 mL Intracatheter Q12H  . thiamine  100 mg Per Tube Daily   Continuous Infusions: . dexmedetomidine (PRECEDEX) IV infusion Stopped (01/18/20 0216)  . feeding supplement (VITAL AF 1.2 CAL) 20 mL/hr at 01/18/20 0517  . fentaNYL infusion INTRAVENOUS 25 mcg/hr (01/18/20 0700)  . norepinephrine (LEVOPHED) Adult infusion 3 mcg/min (01/18/20 0700)  . piperacillin-tazobactam (ZOSYN)  IV 12.5 mL/hr at 01/18/20 0700  . vasopressin 0.03 Units/min (01/18/20 0700)   PRN Meds:.fentaNYL (SUBLIMAZE) injection, midazolam, ondansetron (ZOFRAN) IV, sodium chloride flush  Current Labs: reviewed  10/12 CT abdomen with no hydronephrosis or obstructive structural issue  Physical Exam:  Blood pressure (!) 108/48, pulse (!) 57, temperature 98.2 F (36.8 C), resp. rate (!) 32, height 5' 3" (1.6 m), weight 87.4 kg, SpO2 95 %. NAD, sedated, ET tube in place Regular, normal S1 and S2 Coarse breath sounds bilaterally 2+ LE E Sedated, limited neurological exam Significant jaundice  A 1. Oliguric AKI, worsening; likely  multifactorial including #2, #3, shock, most likely has ATN; very poor candidate for dialysis given severity of liver disease 2. Acute liver failure, alcoholic hepatitis, on steroids, GI following 3. Septic shock on norepinephrine and vasopressin; ABX per CCM 4. Encephalopathy on lactulose  P . No immediate indication for dialysis, would require CRRT; if none renal prognosis is very poor, she would be unlikely to benefit CRRT . Continue supportive care . Would suggest involvement of palliative care,  . Daily weights, Daily Renal Panel, Strict I/Os, Avoid nephrotoxins (NSAIDs, judicious IV Contrast)  . Medication Issues; o Preferred narcotic agents for pain control are hydromorphone, fentanyl, and methadone. Morphine should not be used.  o Baclofen should be avoided o Avoid oral sodium phosphate and magnesium citrate based laxatives / bowel preps    Ryan Sanford MD 01/18/2020, 11:14 AM  Recent Labs  Lab 01/16/20 0340 01/16/20 0340 01/16/20 1624 01/16/20 1624 01/17/20 0212 01/17/20 1650 01/18/20 0042 01/18/20 0404  NA 133*   < > 138   < > 136  --  138 138  K 3.7   < > 2.4*   < > 3.1*  --  3.5 3.4*  CL 90*   < > 90*  --  91*  --   --  93*  CO2 9*   < > 18*  --  23  --   --  25  GLUCOSE 60*   < > 180*  --  163*  --   --  160*  BUN 7   < > 6  --    9  --   --  17  CREATININE 1.22*   < > 1.57*  --  2.09*  --   --  2.88*  CALCIUM 8.4*   < > 8.9  --  8.6*  --   --  9.4  PHOS 4.1  --   --   --   --  2.1*  --  2.5   < > = values in this interval not displayed.   Recent Labs  Lab 02/02/2020 1909 01/06/2020 2250 01/17/20 0212 01/17/20 0212 01/17/20 1306 01/18/20 0042 01/18/20 0404  WBC 9.2   < > 15.0*  --  12.5*  --  12.7*  NEUTROABS 7.3  --   --   --   --   --   --   HGB 9.0*   < > 8.8*   < > 8.6* 9.5* 8.8*  HCT 28.8*   < > 26.4*   < > 26.3* 28.0* 26.8*  MCV 127.4*   < > 108.2*  --  107.3*  --  107.2*  PLT 59*   < > 76*  --  60*  --  46*   < > = values in this interval not  displayed.

## 2020-01-18 NOTE — Progress Notes (Signed)
Gastric residual re-checked with a finding of > 250 mL. MD made aware. Will continue to hold TF at this time. CBG's have been within normal limits. Will continue to monitor closely and re-check at 0400.   Barbaraann Cao, RN 01/17/2020 0030

## 2020-01-19 ENCOUNTER — Inpatient Hospital Stay (HOSPITAL_COMMUNITY): Payer: Self-pay

## 2020-01-19 DIAGNOSIS — Z66 Do not resuscitate: Secondary | ICD-10-CM

## 2020-01-19 DIAGNOSIS — Z515 Encounter for palliative care: Secondary | ICD-10-CM

## 2020-01-19 DIAGNOSIS — J9601 Acute respiratory failure with hypoxia: Secondary | ICD-10-CM

## 2020-01-19 DIAGNOSIS — N179 Acute kidney failure, unspecified: Secondary | ICD-10-CM

## 2020-01-19 DIAGNOSIS — Z7189 Other specified counseling: Secondary | ICD-10-CM

## 2020-01-19 LAB — COMPREHENSIVE METABOLIC PANEL
ALT: 172 U/L — ABNORMAL HIGH (ref 0–44)
AST: 985 U/L — ABNORMAL HIGH (ref 15–41)
Albumin: 2 g/dL — ABNORMAL LOW (ref 3.5–5.0)
Alkaline Phosphatase: 175 U/L — ABNORMAL HIGH (ref 38–126)
Anion gap: 22 — ABNORMAL HIGH (ref 5–15)
BUN: 26 mg/dL — ABNORMAL HIGH (ref 6–20)
CO2: 20 mmol/L — ABNORMAL LOW (ref 22–32)
Calcium: 8.7 mg/dL — ABNORMAL LOW (ref 8.9–10.3)
Chloride: 97 mmol/L — ABNORMAL LOW (ref 98–111)
Creatinine, Ser: 3.7 mg/dL — ABNORMAL HIGH (ref 0.44–1.00)
GFR, Estimated: 14 mL/min — ABNORMAL LOW (ref >60)
Glucose, Bld: 108 mg/dL — ABNORMAL HIGH (ref 70–99)
Potassium: 3.8 mmol/L (ref 3.5–5.1)
Sodium: 139 mmol/L (ref 135–145)
Total Bilirubin: 19 mg/dL (ref 0.3–1.2)
Total Protein: 6.3 g/dL — ABNORMAL LOW (ref 6.5–8.1)

## 2020-01-19 LAB — POCT I-STAT 7, (LYTES, BLD GAS, ICA,H+H)
Acid-base deficit: 1 mmol/L (ref 0.0–2.0)
Acid-base deficit: 6 mmol/L — ABNORMAL HIGH (ref 0.0–2.0)
Bicarbonate: 21.5 mmol/L (ref 20.0–28.0)
Bicarbonate: 24.4 mmol/L (ref 20.0–28.0)
Calcium, Ion: 1.09 mmol/L — ABNORMAL LOW (ref 1.15–1.40)
Calcium, Ion: 1.11 mmol/L — ABNORMAL LOW (ref 1.15–1.40)
HCT: 33 % — ABNORMAL LOW (ref 36.0–46.0)
HCT: 33 % — ABNORMAL LOW (ref 36.0–46.0)
Hemoglobin: 11.2 g/dL — ABNORMAL LOW (ref 12.0–15.0)
Hemoglobin: 11.2 g/dL — ABNORMAL LOW (ref 12.0–15.0)
O2 Saturation: 93 %
O2 Saturation: 96 %
Patient temperature: 36.7
Patient temperature: 37.4
Potassium: 3.5 mmol/L (ref 3.5–5.1)
Potassium: 3.9 mmol/L (ref 3.5–5.1)
Sodium: 139 mmol/L (ref 135–145)
Sodium: 140 mmol/L (ref 135–145)
TCO2: 23 mmol/L (ref 22–32)
TCO2: 26 mmol/L (ref 22–32)
pCO2 arterial: 41.9 mmHg (ref 32.0–48.0)
pCO2 arterial: 48.3 mmHg — ABNORMAL HIGH (ref 32.0–48.0)
pH, Arterial: 7.258 — ABNORMAL LOW (ref 7.350–7.450)
pH, Arterial: 7.373 (ref 7.350–7.450)
pO2, Arterial: 67 mmHg — ABNORMAL LOW (ref 83.0–108.0)
pO2, Arterial: 97 mmHg (ref 83.0–108.0)

## 2020-01-19 LAB — PROTIME-INR
INR: 6.1 (ref 0.8–1.2)
Prothrombin Time: 52.4 seconds — ABNORMAL HIGH (ref 11.4–15.2)

## 2020-01-19 LAB — CBC
HCT: 33.1 % — ABNORMAL LOW (ref 36.0–46.0)
Hemoglobin: 10.5 g/dL — ABNORMAL LOW (ref 12.0–15.0)
MCH: 35.2 pg — ABNORMAL HIGH (ref 26.0–34.0)
MCHC: 31.7 g/dL (ref 30.0–36.0)
MCV: 111.1 fL — ABNORMAL HIGH (ref 80.0–100.0)
Platelets: 35 10*3/uL — ABNORMAL LOW (ref 150–400)
RBC: 2.98 MIL/uL — ABNORMAL LOW (ref 3.87–5.11)
RDW: 23.7 % — ABNORMAL HIGH (ref 11.5–15.5)
WBC: 31 10*3/uL — ABNORMAL HIGH (ref 4.0–10.5)
nRBC: 2.3 % — ABNORMAL HIGH (ref 0.0–0.2)

## 2020-01-19 LAB — GLUCOSE, CAPILLARY
Glucose-Capillary: 113 mg/dL — ABNORMAL HIGH (ref 70–99)
Glucose-Capillary: 112 mg/dL — ABNORMAL HIGH (ref 70–99)
Glucose-Capillary: 106 mg/dL — ABNORMAL HIGH (ref 70–99)

## 2020-01-19 LAB — PHOSPHORUS: Phosphorus: 3.6 mg/dL (ref 2.5–4.6)

## 2020-01-19 LAB — MAGNESIUM: Magnesium: 2.2 mg/dL (ref 1.7–2.4)

## 2020-01-19 LAB — TRIGLYCERIDES: Triglycerides: 223 mg/dL — ABNORMAL HIGH (ref <150)

## 2020-01-19 MED ORDER — ATROPINE SULFATE 1 % OP SOLN: 2.0000 [drp] | Freq: Three times a day (TID) | OPHTHALMIC | Status: DC

## 2020-01-19 MED ORDER — PIPERACILLIN-TAZOBACTAM 3.375 G IVPB
3.3750 g | Freq: Two times a day (BID) | INTRAVENOUS | Status: DC
  Filled 2020-01-19: qty 50

## 2020-01-19 MED ORDER — PROPOFOL 1000 MG/100ML IV EMUL
5.0000 ug/kg/min | INTRAVENOUS | Status: DC
  Administered 2020-01-19: 10 ug/kg/min via INTRAVENOUS
  Administered 2020-01-19: 100 ug/kg/min via INTRAVENOUS
  Filled 2020-01-19 (×2): qty 100

## 2020-01-19 MED ORDER — SODIUM CHLORIDE 0.9% IV SOLUTION: Freq: Once | INTRAVENOUS | Status: AC

## 2020-01-19 MED ORDER — SODIUM CHLORIDE 0.9 % IV SOLN
0.5000 mg/kg/h | INTRAVENOUS | Status: DC
  Filled 2020-01-19: qty 5

## 2020-01-19 MED ORDER — FENTANYL BOLUS VIA INFUSION
50.0000 ug | INTRAVENOUS | Status: DC | PRN
  Administered 2020-01-19: 50 ug via INTRAVENOUS
  Filled 2020-01-19: qty 50

## 2020-01-19 MED ORDER — ATROPINE SULFATE 1 % OP SOLN
2.0000 [drp] | Freq: Three times a day (TID) | OPHTHALMIC | Status: DC | PRN
  Filled 2020-01-19: qty 2

## 2020-01-20 ENCOUNTER — Telehealth: Payer: Self-pay | Admitting: Medical

## 2020-01-20 LAB — BPAM FFP
Blood Product Expiration Date: 202110202359
Blood Product Expiration Date: 202110202359
Blood Product Expiration Date: 202110202359
ISSUE DATE / TIME: 202110160644
ISSUE DATE / TIME: 202110161116
ISSUE DATE / TIME: 202110161116
Unit Type and Rh: 5100
Unit Type and Rh: 5100
Unit Type and Rh: 5100

## 2020-01-20 LAB — CULTURE, BLOOD (ROUTINE X 2)
Culture: NO GROWTH
Special Requests: ADEQUATE

## 2020-01-20 LAB — PREPARE FRESH FROZEN PLASMA
Unit division: 0
Unit division: 0

## 2020-01-20 NOTE — Telephone Encounter (Signed)
Daughter is Insurance risk surveyor per hospital note.  I think she only had one child.

## 2020-01-20 NOTE — Telephone Encounter (Signed)
Please send a sympathy card to family.   She unfortunately passed away this weekend.   Rebecca Goodman

## 2020-01-21 LAB — CULTURE, BLOOD (ROUTINE X 2)
Culture: NO GROWTH
Special Requests: ADEQUATE

## 2020-01-23 LAB — COPPER, SERUM: Copper: 156 ug/dL (ref 80–158)

## 2020-01-23 NOTE — Telephone Encounter (Signed)
Sympathy card sent 

## 2020-02-04 NOTE — Progress Notes (Signed)
   Feb 09, 2020 1217  Clinical Encounter Type  Visited With Patient and family together  Visit Type Other (Comment) (End of Life/ Extubation Pt. )  Referral From Nurse  Consult/Referral To Chaplain  Spiritual Encounters  Spiritual Needs Prayer;Emotional;Grief support  Chaplain responded to page for 2M13 Ms. Radene Knee.  Family requested prayer before extubation.  Prayer, emotional and grief support given.  Stayed with the family until departure.

## 2020-02-04 NOTE — Progress Notes (Signed)
PHARMACY NOTE:  ANTIMICROBIAL RENAL DOSAGE ADJUSTMENT  Current antimicrobial regimen includes a mismatch between antimicrobial dosage and estimated renal function.  As per policy approved by the Pharmacy & Therapeutics and Medical Executive Committees, the antimicrobial dosage will be adjusted accordingly.  Current antimicrobial dosage:  Zosyn 3.375 gm IV q8hr  Indication: Aspiration pneumonia  Renal Function:  Estimated Creatinine Clearance: 20.3 mL/min (A) (by C-G formula based on SCr of 3.7 mg/dL (H)).    Antimicrobial dosage has been changed to:  Zosyn 3.375 gm IV q12hr   Thank you for allowing pharmacy to be a part of this patient's care.  Tera Mater, [facility] 01-29-20 8:17 AM

## 2020-02-04 NOTE — Progress Notes (Signed)
55 mL of ketamine infusion  500mg /176mL wasted with 80m, RN.  100 mL ketamine infusion 500 mg/100 mL returned to pharmacy unused.

## 2020-02-04 NOTE — Death Summary Note (Signed)
DEATH SUMMARY   Patient Details  Name: Rebecca Goodman MRN: [id number] DOB: March 03, 1976  Admission/Discharge Information   Admit Date:  February 02, 2020  Date of Death: Date of Death: Feb 06, 2020  Time of Death: Time of Death: 1348  Length of Stay: 4  Referring Physician: Jac Canavan, PA-C   Reason(s) for Hospitalization  alcoholic cirrhosis  Diagnoses  Preliminary cause of death:   Decompensated alcoholic cirrhosis Secondary Diagnoses (including complications and co-morbidities):  Active Problems:   Altered mental status, unspecified   Shock (HCC)   Encounter for imaging study to confirm nasogastric (NG) tube placement   Encounter for orogastric (OG) tube placement   Hepatic encephalopathy (HCC)   Palliative care by specialist   Goals of care, counseling/discussion   DNR (do not resuscitate)   Terminal care Acute kidney injury Septic shock shock, E.coli UTI acute respiratory failure with hypoxemia  Brief Hospital Course (including significant findings, care, treatment, and services provided and events leading to death)  Rebecca Goodman is a 44 y.o. year old female who was admitted on 02-02-23 in the setting of several days of confusion and abnormal behavior. In the emergency room she was found to have severe shock.  A work up revealed that she had a cirrhotic liver, UTI and septic shock.  She required intubation on admission for airway protection and acute respiratory failure with hypoxemia.  She was treated with IV fluids, vasopressors, mechanical ventialtion and antibiotics per standard protocol.  However she developed worsening shock and end organ damage in the form of acute kidney injury.  She was treated for hepatic encephalopathy as well. Given her overall very poor prognosis from severe advanced liver disease it was felt that the only cure for her illness would be a liver transplant, but that was not possible considering her ongoing alcohol abuse.  We discussed this with her family  and with the assistance of palliative medicine she was placed on comfort measures and we withdrew care on 02/06/2023 with a palliative intent.  She died peacefully after family visited.    Pertinent Labs and Studies  Significant Diagnostic Studies DG Chest 1 View  Result Date: 02/02/20 CLINICAL DATA:  Altered level of consciousness EXAM: CHEST  1 VIEW COMPARISON:  None. FINDINGS: Cardiac silhouette is accentuated by low lung volumes and portable technique. No visible pleural effusions or pneumothorax. No acute osseous abnormality. IMPRESSION: No evidence of acute cardiopulmonary disease, although evaluation of the lung bases is limited by portable semi erect technique. Electronically Signed   By: Feliberto Harts MD   On: 02/02/20 20:06   CT HEAD WO CONTRAST  Result Date: 02-02-2020 CLINICAL DATA:  Mental status change, encephalopathy EXAM: CT HEAD WITHOUT CONTRAST TECHNIQUE: Contiguous axial images were obtained from the base of the skull through the vertex without intravenous contrast. COMPARISON:  CT head 04/02/2018 MRI 04/03/2018 FINDINGS: Brain: No evidence of acute infarction, hemorrhage, hydrocephalus, extra-axial collection, visible mass lesion or mass effect. Basal cisterns are patent. Slight CSF expansion of the sella, similar to prior. Remaining midline intracranial structures are unremarkable. Cerebellar tonsils normally positioned. Vascular: No hyperdense vessel or unexpected calcification. Skull: No calvarial fracture or suspicious osseous lesion. No scalp swelling or hematoma. Remote appearing right orbital floor fracture though new since comparisons in 2019. Sinuses/Orbits: Minimal thickening in the ethmoid sinuses. Remaining paranasal sinuses and mastoid air cells are predominantly clear. Right orbital floor fracture, as above. Included orbital contents are otherwise unremarkable. Other: None. IMPRESSION: 1. No acute intracranial abnormality. 2. Partially empty sella, similar  to  comparison. 3. Remote appearing right orbital floor fracture appears remote though is a new finding since comparisons in 2019. Electronically Signed   By: Kreg Shropshire M.D.   On: 01/14/2020 23:27   CT ABDOMEN PELVIS W CONTRAST  Result Date: 02/03/2020 CLINICAL DATA:  Abdominal pain, acute, nonlocalized. Alcohol use, Guillain Barre. Icterus. EXAM: CT ABDOMEN AND PELVIS WITH CONTRAST TECHNIQUE: Multidetector CT imaging of the abdomen and pelvis was performed using the standard protocol following bolus administration of intravenous contrast. CONTRAST:  OMNIPAQUE IOHEXOL 300 MG/ML  SOLN COMPARISON:  None. FINDINGS: Lower chest: Peribronchovascular consolidative airspace opacities within the lingula as well as left lower lobe. Patchy ground-glass airspace opacities within the right lower lobe. Consolidative patchy airspace opacities within the right upper lobe and right middle lobe. Hepatobiliary: The liver is enlarged measuring up to 25.5 cm. Markedly diffuse Lea hypodense hepatic parenchyma compared to the spleen consistent with hepatic steatosis. No focal hepatic lesions; however, limited evaluation on this single venous contrast study. Prominent gallbladder wall which can be seen in chronic liver disease. No radiopaque gallstones identified. No biliary ductal dilatation. Pancreas: No focal lesion. Normal pancreatic contour. No surrounding inflammatory changes. No main pancreatic ductal dilatation. Spleen: Normal in size without focal abnormality. Adrenals/Urinary Tract: No adrenal nodule bilaterally. Bilateral kidneys enhance symmetrically. No hydronephrosis. No hydroureter. The urinary bladder is unremarkable. Stomach/Bowel: Stomach is within normal limits. The appendix not definitely identified. No evidence of bowel wall thickening, distention, or inflammatory changes. Fluid noted within the lumen of the colon; the fluid is noted to measure 60-80 Hounsfield units in density. Vascular/Lymphatic: No  abdominal aorta or iliac aneurysm. No abdominal, pelvic, or inguinal lymphadenopathy. the portal, splenic, superior mesenteric veins are patent. Reproductive: Suggestion of isodense pericentimeter soft tissue lesions within the right uterine wall suggestive of uterine fibroids no adnexal lesions. Other: No intraperitoneal free fluid. No intraperitoneal free gas. No organized fluid collection. Musculoskeletal: Mild subcutaneus soft tissue edema. No suspicious lytic or blastic osseous lesions. No acute displaced fracture. Multilevel degenerative changes of the spine. IMPRESSION: 1. Pulmonary findings suggestive of infection or inflammation (such as aspiration pneumonia). COVID-19 not excluded. Recommend short-term follow-up CT post treatment to exclude underlying lesion/malignancy. 2. Hepatomegaly and hepatic steatosis. 3. Fast state of transition/diarrhea with high density fluid within the lumen of the colon which may be due to PO contrast administration versus blood products. Correlate with physical exam and history. Electronically Signed   By: Tish Frederickson M.D.   On: 01/11/2020 23:55   DG CHEST PORT 1 VIEW  Result Date: 2020-02-09 CLINICAL DATA:  Dyspnea EXAM: PORTABLE CHEST 1 VIEW COMPARISON:  01/18/2020 FINDINGS: Endotracheal tube terminates 10 mm above the carina. Enteric tube courses into the distal stomach. Left IJ venous catheter terminates in the upper right atrium, 4 cm below the superior cavoatrial junction. Low lung volumes. Mild patchy opacity in the right perihilar lung and left lower lobe. No pneumothorax. The heart is normal in size. IMPRESSION: Endotracheal tube terminates 10 mm above the carina. Left IJ venous catheter terminates in the upper right atrium, 4 cm below the superior cavoatrial junction. Low lung volumes with stable patchy airspace opacities bilaterally. Electronically Signed   By: Charline Bills M.D.   On: 02-09-20 09:19   DG CHEST PORT 1 VIEW  Result Date:  01/18/2020 CLINICAL DATA:  44 year old female status post intubation. EXAM: PORTABLE CHEST 1 VIEW COMPARISON:  Chest radiograph dated 01/18/2020. FINDINGS: Endotracheal tube has been pulled back and with tip approximately 1  cm above the carina. Recommend retraction by approximately 2-3 cm for optimal positioning. Enteric tube extends below the diaphragm with tip beyond the inferior margin of the image. Left IJ central venous line in similar position. Shallow inspiration with bilateral mid to lower lung field airspace opacities as seen previously. No large pleural effusion. No pneumothorax. Stable cardiac silhouette. No acute osseous pathology. IMPRESSION: 1. Endotracheal tube has been pulled back with tip approximately 1 cm above the carina. Recommend retraction by 2-3 cm for optimal positioning. 2. Shallow inspiration with bilateral mid to lower lung field airspace opacities as seen previously. Electronically Signed   By: Elgie Collard M.D.   On: 01/18/2020 20:40   DG CHEST PORT 1 VIEW  Result Date: 01/18/2020 CLINICAL DATA:  44 year old female with history of respiratory failure. EXAM: PORTABLE CHEST 1 VIEW COMPARISON:  Chest x-ray 01/16/2020. FINDINGS: Patient remains intubated, with the tip of the endotracheal tube within 4 mm of the carina. There is a left-sided internal jugular central venous catheter with tip terminating in the right atrium. A nasogastric tube is seen extending into the stomach, however, the tip of the nasogastric tube extends below the lower margin of the image. Lung volumes remain low. Worsening aeration with patchy airspace disease and areas of interstitial prominence most evident throughout the mid to lower lungs bilaterally, concerning for multilobar pneumonia. No definite pleural effusions. No evidence of pulmonary edema. No pneumothorax. Heart size is normal. Upper mediastinal contours are within normal limits. IMPRESSION: 1. Support apparatus, as above. Please take note of  the persistent low lying endotracheal tube. Withdrawal of the tube 3 cm for more optimal placement is recommended. 2. Low lung volumes with worsening aeration throughout the mid to lower lungs bilaterally, concerning for progressive multilobar bilateral pneumonia. Electronically Signed   By: Trudie Reed M.D.   On: 01/18/2020 09:59   DG CHEST PORT 1 VIEW  Result Date: 01/16/2020 CLINICAL DATA:  Follow-up intubation.  Altered mental status. EXAM: PORTABLE CHEST 1 VIEW COMPARISON:  01-Feb-2020 FINDINGS: Endotracheal tube tip is at the carina. Consider withdrawal 1 or 2 cm. Orogastric or nasogastric tube enters the stomach. Left internal jugular central line tip in the proximal right atrium. Withdraw 2 cm to be above the right atrium if desired. Newly seen/worsened infiltrate/atelectasis in the mid and lower lungs bilaterally. This could represent infectious pneumonia or aspiration. No pleural effusion. IMPRESSION: 1. Endotracheal tube tip at the carina. Consider withdrawal 1 or 2 cm. 2. Newly seen/worsened infiltrate/atelectasis in the mid and lower lungs bilaterally. This could represent infectious pneumonia or aspiration. Electronically Signed   By: Paulina Fusi M.D.   On: 01/16/2020 10:18   DG Abd Portable 1V  Result Date: 01/16/2020 CLINICAL DATA:  Nasogastric tube placement EXAM: PORTABLE ABDOMEN - 1 VIEW COMPARISON:  None. FINDINGS: Tip and side port of the nasogastric tube project over the stomach. Nonobstructive bowel gas pattern. IMPRESSION: Nasogastric tube tip and side port in the stomach. Electronically Signed   By: Deatra Robinson M.D.   On: 01/16/2020 01:18   EEG adult  Result Date: 01/16/2020 Charlsie Quest, MD     01/16/2020  8:15 AM Patient Name: Angella Montas MRN: [id number] Epilepsy Attending: Charlsie Quest Referring Physician/Provider: Dr. Glyn Ade Date: 01/16/2020 Duration: 25.29 mins Patient history: 44 year old female with history of extensive alcohol abuse was  presented with altered mental status for last several days.  EEG to evaluate for seizures. Level of alertness: Awake/ lethargic, asleep AEDs during EEG study: Ativan Technical  aspects: This EEG study was done with scalp electrodes positioned according to the 10-20 International system of electrode placement. Electrical activity was acquired at a sampling rate of  and reviewed with a high frequency filter of  and a low frequency filter of . EEG data were recorded continuously and digitally stored. Description: No posterior dominant rhythm was seen.  Sleep was characterized by sleep spindles (13 to 15 Hz), maximal frontocentral region.  EEG showed continuous generalized 3 to 6 Hz theta-delta slowing.  Hyperventilation and photic stimulation were not performed.   Of note, parts of EEG were difficult interpret due to significant electrode artifact. ABNORMALITY -Continuous slow, generalized IMPRESSION: This technically difficult study suggestive of moderate diffuse encephalopathy, nonspecific to etiology. No seizures or epileptiform discharges were seen throughout the recording. Charlsie Quest    Microbiology Recent Results (from the past 240 hour(s))  Blood Cultures (routine x 2)     Status: None (Preliminary result)   Collection Time: 02/07/20  7:04 PM   Specimen: BLOOD  Result Value Ref Range Status   Specimen Description BLOOD SITE NOT SPECIFIED  Final   Special Requests   Final    BOTTLES DRAWN AEROBIC AND ANAEROBIC Blood Culture adequate volume   Culture   Final    NO GROWTH 4 DAYS Performed at [facility], [address] [place], [place] [address]    Report Status PENDING  Incomplete  Respiratory Panel by RT PCR (Flu A&B, Covid) -     Status: None   Collection Time: 02/07/20  7:53 PM   Specimen: Nasopharyngeal  Result Value Ref Range Status   SARS Coronavirus 2 by RT PCR NEGATIVE NEGATIVE Final    Comment: (NOTE) SARS-CoV-2 target nucleic acids are NOT DETECTED.  The  SARS-CoV-2 RNA is generally detectable in upper respiratoy specimens during the acute phase of infection. The lowest concentration of SARS-CoV-2 viral copies this assay can detect is 131 copies/mL. A negative result does not preclude SARS-Cov-2 infection and should not be used as the sole basis for treatment or other patient management decisions. A negative result may occur with  improper specimen collection/handling, submission of specimen other than nasopharyngeal swab, presence of viral mutation(s) within the areas targeted by this assay, and inadequate number of viral copies (<131 copies/mL). A negative result must be combined with clinical observations, patient history, and epidemiological information. The expected result is Negative.  Fact Sheet for Patients:  https://www.moore.com/  Fact Sheet for Healthcare Providers:  https://www.young.biz/  This test is no t yet approved or cleared by the [place] FDA and  has been authorized for detection and/or diagnosis of SARS-CoV-2 by FDA under an Emergency Use Authorization (EUA). This EUA will remain  in effect (meaning this test can be used) for the duration of the COVID-19 declaration under Section 564(b)(1) of the Act, 21 U.S.C. section 360bbb-3(b)(1), unless the authorization is terminated or revoked sooner.     Influenza A by PCR NEGATIVE NEGATIVE Final   Influenza B by PCR NEGATIVE NEGATIVE Final    Comment: (NOTE) The Xpert Xpress SARS-CoV-2/FLU/RSV assay is intended as an aid in  the diagnosis of influenza from Nasopharyngeal swab specimens and  should not be used as a sole basis for treatment. Nasal washings and  aspirates are unacceptable for Xpert Xpress SARS-CoV-2/FLU/RSV  testing.  Fact Sheet for Patients: https://www.moore.com/  Fact Sheet for Healthcare Providers: https://www.young.biz/  This test is not yet approved or cleared  by the [place] FDA and  has  been authorized for detection and/or diagnosis of SARS-CoV-2 by  FDA under an Emergency Use Authorization (EUA). This EUA will remain  in effect (meaning this test can be used) for the duration of the  Covid-19 declaration under Section 564(b)(1) of the Act, 21  U.S.C. section 360bbb-3(b)(1), unless the authorization is  terminated or revoked. Performed at [facility], [address] [place], [place] [address]   Urine culture     Status: Abnormal   Collection Time: 01/18/2020  8:51 PM   Specimen: Urine, Random  Result Value Ref Range Status   Specimen Description URINE, RANDOM  Final   Special Requests   Final    NONE Performed at [facility], [address] [place], [place] [address]    Culture >=100,000 COLONIES/mL ESCHERICHIA COLI (A)  Final   Report Status 01/18/2020 FINAL  Final   Organism ID, Bacteria ESCHERICHIA COLI (A)  Final      Susceptibility   Escherichia coli - MIC*    AMPICILLIN <=2 SENSITIVE Sensitive     CEFAZOLIN <=4 SENSITIVE Sensitive     CEFTRIAXONE <=0.25 SENSITIVE Sensitive     CIPROFLOXACIN <=0.25 SENSITIVE Sensitive     GENTAMICIN <=1 SENSITIVE Sensitive     IMIPENEM <=0.25 SENSITIVE Sensitive     NITROFURANTOIN <=16 SENSITIVE Sensitive     TRIMETH/SULFA <=20 SENSITIVE Sensitive     AMPICILLIN/SULBACTAM <=2 SENSITIVE Sensitive     PIP/TAZO <=4 SENSITIVE Sensitive     * >=100,000 COLONIES/mL ESCHERICHIA COLI  MRSA PCR Screening     Status: None   Collection Time: 02/02/2020 11:50 PM   Specimen: Nasal Mucosa; Nasopharyngeal  Result Value Ref Range Status   MRSA by PCR NEGATIVE NEGATIVE Final    Comment:        The GeneXpert MRSA Assay (FDA approved for NASAL specimens only), is one component of a comprehensive MRSA colonization surveillance program. It is not intended to diagnose MRSA infection nor to guide or monitor treatment for MRSA infections. Performed at [facility], [address] [place], [place] [address]   Blood Cultures (routine x 2)     Status: None (Preliminary result)   Collection Time: 01/16/20  3:24 AM   Specimen: BLOOD LEFT HAND  Result Value Ref Range Status   Specimen Description BLOOD LEFT HAND  Final   Special Requests AEROBIC BOTTLE ONLY Blood Culture adequate volume  Final   Culture   Final    NO GROWTH 3 DAYS Performed at [facility], [address] [place], [place] [address]    Report Status PENDING  Incomplete    Lab Basic Metabolic Panel: Recent Labs  Lab 01/16/20 0340 01/16/20 0340 01/16/20 1624 01/16/20 1624 01/17/20 0212 01/17/20 0212 01/17/20 1650 01/18/20 0042 01/18/20 0404 01/18/20 2030 05-05-2019 0045 05-05-2019 0501 05-05-2019 0912  NA 133*   < > 138   < > 136   < >  --  138 138  --  139 139 140  K 3.7   < > 2.4*   < > 3.1*   < >  --  3.5 3.4*  --  3.5 3.8 3.9  CL 90*  --  90*  --  91*  --   --   --  93*  --   --  97*  --   CO2 9*  --  18*  --  23  --   --   --  25  --   --  20*  --   GLUCOSE 60*  --  180*  --  163*  --   --   --  160*  --   --  108*  --   BUN 7  --  6  --  9  --   --   --  17  --   --  26*  --   CREATININE 1.22*  --  1.57*  --  2.09*  --   --   --  2.88*  --   --  3.70*  --   CALCIUM 8.4*  --  8.9  --  8.6*  --   --   --  9.4  --   --  8.7*  --   MG 2.3  --   --   --   --   --  2.3  --  2.2 2.3  --  2.2  --   PHOS 4.1  --   --   --   --   --  2.1*  --  2.5 2.7  --  3.6  --    < > = values in this interval not displayed.   Liver Function Tests: Recent Labs  Lab February 02, 2020 1909 01/16/20 0340 01/17/20 0212 01/09/2020 0501  AST 480* 841* 2,242* 985*  ALT 100* 107* 187* 172*  ALKPHOS 202* 147* 170* 175*  BILITOT 13.2* 12.8* 18.7* 19.0*  PROT 6.6 6.1* 6.2* 6.3*  ALBUMIN 1.6* 2.0* 2.1* 2.0*   Recent Labs  Lab 2020/02/02 2221  LIPASE 22   Recent Labs  Lab 02-Feb-2020 1904  AMMONIA 123*   CBC: Recent Labs  Lab 2020-02-02 1909 02-02-20 2250 01/16/20 1624 01/16/20 1624 01/17/20 0212  01/17/20 0212 01/17/20 1306 01/17/20 1306 01/18/20 0042 01/18/20 0404 01/06/2020 0045 01/22/2020 0501 02/03/2020 0912  WBC 9.2   < > 13.0*  --  15.0*  --  12.5*  --   --  12.7*  --  31.0*  --   NEUTROABS 7.3  --   --   --   --   --   --   --   --   --   --   --   --   HGB 9.0*   < > 6.0*   < > 8.8*   < > 8.6*   < > 9.5* 8.8* 11.2* 10.5* 11.2*  HCT 28.8*   < > 18.8*   < > 26.4*   < > 26.3*   < > 28.0* 26.8* 33.0* 33.1* 33.0*  MCV 127.4*   < > 123.7*  --  108.2*  --  107.3*  --   --  107.2*  --  111.1*  --   PLT 59*   < > 76*  --  76*  --  60*  --   --  46*  --  35*  --    < > = values in this interval not displayed.   Cardiac Enzymes: Recent Labs  Lab 01/17/20 1449  CKTOTAL 588*   Sepsis Labs: Recent Labs  Lab 02-02-20 2221 02-Feb-2020 2224 01/16/20 0340 01/16/20 1624 01/16/20 1627 01/17/20 0212 01/17/20 1306 01/18/20 0404 01/18/20 0535 01/18/20 2019 01/22/2020 0501  PROCALCITON 0.51  --  0.54  --   --  3.54  --   --   --   --   --   WBC  --    < > 8.8   < >  --  15.0* 12.5* 12.7*  --   --  31.0*  LATICACIDVEN  --    < > >11.0*  --  >11*  --  7.8*  --  4.7* 7.6*  --    < > = values in this interval not displayed.    Procedures/Operations  10/13 ETT >  10/13 L IJ CVL >    Heber [place] 2020/02/08, 2:41 PM

## 2020-02-04 NOTE — Progress Notes (Signed)
Admit: 01/08/2020 LOS: 4  49F acute liver failure/alcoholic hepatitis; shock; AKI  Subjective:  Marland Kitchen Remains on norepinephrine and vasopressin . UOP 175 mL in the past 24 hours, is 10.6 L net positive . Creatinine continues to worsen, K3.8, 20 . Profoundly hypoxic requiring 100% FiO2  10/15 0701 - 10/16 0700 In: 3008.8 [I.V.:1783.6; NG/GT:330; IV Piggyback:895.2] Out: 175 [Urine:175]  Filed Weights   01/16/20 0500  Weight: 87.4 kg    Scheduled Meds: . chlorhexidine gluconate (MEDLINE KIT)  15 mL Mouth Rinse BID  . Chlorhexidine Gluconate Cloth  6 each Topical Daily  . folic acid  1 mg Per Tube Daily  . heparin  5,000 Units Subcutaneous Q8H  . hydrocortisone sod succinate (SOLU-CORTEF) inj  50 mg Intravenous Q6H  . lactulose  30 g Per Tube TID  . mouth rinse  15 mL Mouth Rinse 10 times per day  . multivitamin with minerals  1 tablet Per Tube Daily  . pantoprazole (PROTONIX) IV  40 mg Intravenous BID  . sodium chloride flush  10-40 mL Intracatheter Q12H  . thiamine  100 mg Per Tube Daily   Continuous Infusions: . feeding supplement (VITAL AF 1.2 CAL) Stopped (01/18/20 2118)  . fentaNYL infusion INTRAVENOUS 150 mcg/hr (Feb 08, 2020 0700)  . ketamine (KETALAR) Adult IV Infusion 0.5 mg/kg/hr (02/08/20 0721)  . norepinephrine (LEVOPHED) Adult infusion 35 mcg/min (02/08/2020 0830)  . piperacillin-tazobactam (ZOSYN)  IV    . propofol (DIPRIVAN) infusion 10 mcg/kg/min (02/08/20 0830)  . vasopressin 0.03 Units/min (02/08/20 0720)   PRN Meds:.fentaNYL (SUBLIMAZE) injection, ondansetron (ZOFRAN) IV, sodium chloride flush  Current Labs: reviewed  10/12 CT abdomen with no hydronephrosis or obstructive structural issue  Physical Exam:  Blood pressure (!) 144/75, pulse (!) 121, temperature 99.1 F (37.3 C), temperature source Bladder, resp. rate (!) 22, height $RemoveBe'5\' 3"'AvKfsLplw$  (1.6 m), weight 87.4 kg, SpO2 95 %. NAD, sedated, ET tube in place Regular, normal S1 and S2 Coarse breath sounds  bilaterally 2+ LEE Sedated, limited neurological exam Significant jaundice  A 1. Anuric AKI, worsening; likely multifactorial including #2, #3, shock, most likely has ATN; very poor candidate for dialysis given severity of liver disease 2. Acute liver failure, alcoholic hepatitis, on steroids, GI following 3. Septic shock on norepinephrine and vasopressin; ABX per CCM 4. Encephalopathy on lactulose  P . No immediate indication for dialysis, would require CRRT; however, given severity of her illness I doubt that CRRT would be effective in changing her overall prognosis . I support exploring goals of care with recommendations not to escalate any further with family . Daily weights, Daily Renal Panel, Strict I/Os, Avoid nephrotoxins (NSAIDs, judicious IV Contrast)  . Medication Issues; o Preferred narcotic agents for pain control are hydromorphone, fentanyl, and methadone. Morphine should not be used.  o Baclofen should be avoided o Avoid oral sodium phosphate and magnesium citrate based laxatives / bowel preps    Pearson Grippe MD 02-08-2020, 9:21 AM  Recent Labs  Lab 01/17/20 0212 01/17/20 1650 01/18/20 0404 01/18/20 0404 01/18/20 2030 02-08-20 0045 February 08, 2020 0501 2020-02-08 0912  NA 136   < > 138   < >  --  139 139 140  K 3.1*   < > 3.4*   < >  --  3.5 3.8 3.9  CL 91*  --  93*  --   --   --  97*  --   CO2 23  --  25  --   --   --  20*  --  GLUCOSE 163*  --  160*  --   --   --  108*  --   BUN 9  --  17  --   --   --  26*  --   CREATININE 2.09*  --  2.88*  --   --   --  3.70*  --   CALCIUM 8.6*  --  9.4  --   --   --  8.7*  --   PHOS  --    < > 2.5  --  2.7  --  3.6  --    < > = values in this interval not displayed.   Recent Labs  Lab 01/23/2020 1909 01/29/2020 2250 01/17/20 1306 01/18/20 0042 01/18/20 0404 01/18/20 0404 02-05-20 0045 2020/02/05 0501 2020/02/05 0912  WBC 9.2   < > 12.5*  --  12.7*  --   --  31.0*  --   NEUTROABS 7.3  --   --   --   --   --   --   --   --    HGB 9.0*   < > 8.6*   < > 8.8*   < > 11.2* 10.5* 11.2*  HCT 28.8*   < > 26.3*   < > 26.8*   < > 33.0* 33.1* 33.0*  MCV 127.4*   < > 107.3*  --  107.2*  --   --  111.1*  --   PLT 59*   < > 60*  --  46*  --   --  35*  --    < > = values in this interval not displayed.

## 2020-02-04 NOTE — Progress Notes (Signed)
eLink Physician-Brief Progress Note Patient Name: Rebecca Goodman DOB: [birth year] MRN: [id number]   Date of Service  February 15, 2020  HPI/Events of Note  Coagulopathy - INR = 6.1  eICU Interventions  Plan: 1. Transfuse 3 units FFP now. 2. Repeat PT/INR at 12 noon.      Intervention Category Intermediate Interventions: Coagulopathy - evaluation and management  Gerold Sar Eugene 02-15-20, 6:26 AM

## 2020-02-04 NOTE — Progress Notes (Signed)
   100 mL ketamine infusion 500 mg/100 mL wasted in steri-cycle in Main pharmacy due to patient expired.  Witnessed by Haroldine Laws, Pharm D  Noah Delaine, RPh Clinical Pharmacist  02/02/2020 5:10 PM

## 2020-02-04 NOTE — Progress Notes (Signed)
NAME:  Rebecca Goodman, MRN:  [id number], DOB:  March 04, 1976, LOS: 4 ADMISSION DATE:  02/01/2020, CONSULTATION DATE:  10/12 REFERRING MD:  Maryan Rued, CHIEF COMPLAINT:  confusion   Brief History   44 year old female with history of alcohol abuse presents with altered mental status times days.  In ED found to have hepatic failure with lactic acid greater than 11 and was lethargic.  PCCM asked to admit.  Intubated for airway protection.  Past Medical History   has a past medical history of Arthralgia of hip, Arthralgia of knee, Folate deficiency (04/05/2018), Guillain Barr syndrome ([place]), Macrocytic anemia (04/05/2018), and Obesity.   Significant Hospital Events   10/12 admit 10/13 > intubated  Consults:  GI  Procedures:  10/13 ETT >  10/13 L IJ CVL >   Significant Diagnostic Tests:  CT head 10/12 > stable, partially empty sella. New finding of remote appearing R orbital floor fracture  CT abdomen pelvis 10/12 > Hepatomegaly, hepatic steatosis.High density fluid in the colon, question representing p.o. contrast vs blood products. Bilateral airspace disease.  Micro Data:  10/12 blood >  Antimicrobials:  10/12 cefepime x1 10/12 vanc> 10/13 10/13 zosyn >   Interim history/subjective:  Worsening dyssynchrony, renal failure, and shock  Objective   Blood pressure 106/77, pulse (!) 121, temperature 99.1 F (37.3 C), resp. rate 16, height $RemoveBe'5\' 3"'kXjkIieSy$  (1.6 m), weight 87.4 kg, SpO2 92 %.    Vent Mode: PRVC FiO2 (%):  [100 %] 100 % Set Rate:  [28 bmp-30 bmp] 28 bmp Vt Set:  [410 mL] 410 mL PEEP:  [5 cmH20-10 cmH20] 10 cmH20 Plateau Pressure:  [24 cmH20-32 cmH20] 24 cmH20   Intake/Output Summary (Last 24 hours) at 02-12-2020 0811 Last data filed at 02/12/2020 0700 Gross per 24 hour  Intake 2953.56 ml  Output 175 ml  Net 2778.56 ml   Filed Weights   01/16/20 0500  Weight: 87.4 kg    Examination:  General:  In bed on vent HENT: NCAT ETT in place PULM: CTA B, vent supported  breathing CV: RRR, no mgr GI: BS+, soft, nontender MSK: normal bulk and tone Neuro: sedated on vent   Resolved Hospital Problem list    Assessment & Plan:  Decompensated hepatic failure 2/2 alcohol use disorder Acute hepatic encephalopathy > worsening ventilator dyssynchrony Acute respiratory failure with hypoxemia Septic shock > worse AKI> worse  Discussion: She is dying, we do not have a good option for her long term management as she ultimately needs a liver transplant and remains in decompensated liver failure with worsening multi-organ failure.  We met with the family (palliative medicine, later me) and explained that she will not survive this.  They wish to proceed with comfort measures.  Plan: Proceed with comfort measures/withdrawal of mechanical ventilator support Will d/c mechanical ventilation and vasopressor support after family arrives Full comfort measures at this point, code status DNR    Best practice:   N/A, see above  Labs   CBC: Recent Labs  Lab 01/25/2020 1909 01/07/2020 2250 01/16/20 1624 01/16/20 1624 01/17/20 0212 01/17/20 0212 01/17/20 1306 01/18/20 0042 01/18/20 0404 02-12-2020 0045 2020-02-12 0501  WBC 9.2   < > 13.0*  --  15.0*  --  12.5*  --  12.7*  --  31.0*  NEUTROABS 7.3  --   --   --   --   --   --   --   --   --   --   HGB 9.0*   < > 6.0*   < >  8.8*   < > 8.6* 9.5* 8.8* 11.2* 10.5*  HCT 28.8*   < > 18.8*   < > 26.4*   < > 26.3* 28.0* 26.8* 33.0* 33.1*  MCV 127.4*   < > 123.7*  --  108.2*  --  107.3*  --  107.2*  --  111.1*  PLT 59*   < > 76*  --  76*  --  60*  --  46*  --  35*   < > = values in this interval not displayed.    Basic Metabolic Panel: Recent Labs  Lab 01/16/20 0340 01/16/20 0340 01/16/20 1624 01/16/20 1624 01/17/20 0212 01/17/20 1650 01/18/20 0042 01/18/20 0404 01/18/20 2030 2020/02/13 0045 02/13/20 0501  NA 133*   < > 138   < > 136  --  138 138  --  139 139  K 3.7   < > 2.4*   < > 3.1*  --  3.5 3.4*  --  3.5  3.8  CL 90*  --  90*  --  91*  --   --  93*  --   --  97*  CO2 9*  --  18*  --  23  --   --  25  --   --  20*  GLUCOSE 60*  --  180*  --  163*  --   --  160*  --   --  108*  BUN 7  --  6  --  9  --   --  17  --   --  26*  CREATININE 1.22*  --  1.57*  --  2.09*  --   --  2.88*  --   --  3.70*  CALCIUM 8.4*  --  8.9  --  8.6*  --   --  9.4  --   --  8.7*  MG 2.3  --   --   --   --  2.3  --  2.2 2.3  --  2.2  PHOS 4.1  --   --   --   --  2.1*  --  2.5 2.7  --  3.6   < > = values in this interval not displayed.   GFR: Estimated Creatinine Clearance: 20.3 mL/min (A) (by C-G formula based on SCr of 3.7 mg/dL (H)). Recent Labs  Lab 02/03/2020 2221 01/14/2020 2224 01/16/20 0340 01/16/20 1624 01/16/20 1627 01/17/20 0212 01/17/20 1306 01/18/20 0404 01/18/20 0535 01/18/20 2019 2020-02-13 0501  PROCALCITON 0.51  --  0.54  --   --  3.54  --   --   --   --   --   WBC  --    < > 8.8   < >  --  15.0* 12.5* 12.7*  --   --  31.0*  LATICACIDVEN  --    < > >11.0*  --  >11*  --  7.8*  --  4.7* 7.6*  --    < > = values in this interval not displayed.    Liver Function Tests: Recent Labs  Lab 01/20/2020 1909 01/16/20 0340 01/17/20 0212 February 13, 2020 0501  AST 480* 841* 2,242* 985*  ALT 100* 107* 187* 172*  ALKPHOS 202* 147* 170* 175*  BILITOT 13.2* 12.8* 18.7* 19.0*  PROT 6.6 6.1* 6.2* 6.3*  ALBUMIN 1.6* 2.0* 2.1* 2.0*   Recent Labs  Lab 01/04/2020 2221  LIPASE 22   Recent Labs  Lab 01/14/2020 1904  AMMONIA 123*    ABG  Component Value Date/Time   PHART 7.373 2020-02-15 0045   PCO2ART 41.9 02/15/20 0045   PO2ART 67 (L) 02-15-20 0045   HCO3 24.4 2020-02-15 0045   TCO2 26 Feb 15, 2020 0045   ACIDBASEDEF 1.0 02-15-20 0045   O2SAT 93.0 2020-02-15 0045     Coagulation Profile: Recent Labs  Lab 01/12/2020 1909 01/16/20 0340 01/16/20 1624 Feb 15, 2020 0501  INR 5.1* 8.0* 3.4* 6.1*    Cardiac Enzymes: Recent Labs  Lab 01/17/20 1449  CKTOTAL 588*    HbA1C: No results found for:  HGBA1C  CBG: Recent Labs  Lab 01/18/20 1612 01/18/20 2004 02/15/2020 0126 02-15-2020 0432 02/15/20 0744  GLUCAP 150* 104* 113* 112* 106*     Critical care time: 35 minutes    Roselie Awkward, MD [facility] [place] [facility] Pager: [phone] Cell: [phone] If no response, call [phone]

## 2020-02-04 NOTE — Progress Notes (Signed)
CRITICAL VALUE ALERT  Critical Value:  INR 6.1  Date & Time Notied:  01/05/2020 0618  Provider Notified: E-link notified  Orders Received/Actions taken: Transfuse 3 units FFPs  Barbaraann Cao, RN  01/04/2020 [phone]

## 2020-02-04 NOTE — Consult Note (Signed)
Palliative Medicine Inpatient Consult Note  Reason for consult:  Goals of Care "code status, long-term prognosis discussions with family & patient when extubated"  HPI:  Per intake H&P --> 44 year old female with past medical history as below, which is significant for DM Rebecca Goodman syndrome and alcohol abuse.  Per family reports she typically drinks quite heavily, but had been attempting to cut down on drinking and was down to 1-2 drinks per day.  EMS was dispatched on 10/12 in the late evening hours due to altered mental status times several days.  It got to the point she was quite delirious and was unable to recognize family members.  She had been found on the floor beside the bed more than once in the preceding 24 hours.  Upon arrival to the emergency department she was quite lethargic and and there was questionable seizure activity.  CBG reading was "low".  She was hypothermic with temperature 90.4 F.  Laboratory evaluation was significant for lactic acid greater than 11, ammonia 123, AST 480, ALT 100, total bilirubin 13.2, INR 5.1, and alcohol level undetectable.  Hypothermia raise concern for sepsis and she was started on empiric antibiotics.   Palliative care was asked to get involved to aid in goals of care discussions.  Clinical Assessment/Goals of Care: I have reviewed medical records including EPIC notes, labs and imaging, received report from bedside RN, assessed the patient.    I called Rebecca Goodman (daughter) to further discuss diagnosis prognosis, GOC, EOL wishes, disposition and options.   I introduced Palliative Medicine as specialized medical care for people living with serious illness. It focuses on providing relief from the symptoms and stress of a serious illness. The goal is to improve quality of life for both the patient and the family.  I asked Rebecca Goodman to give me an impression of the type a woman her mother is.  Rebecca Goodman shares that her mother is an extremely nice, kind, calm,  and outgoing person.  She was loved by everyone who she met.  She used to work in [employer] [place] and later at [employer] in [place], [place].  For  enjoyment she likes to read, took pride in her home, and find shopping deals.  She had never been married and Rebecca Goodman is her only child.  She was a faithful woman and practiced within the [employer] denomination.  From a physical perspective over the last 1 to 2 years Rebecca Goodman due to her autoimmune disease.  Her daughter shares that her cleanliness has declined over time.  She has suffered for the last 2 years from major depressive disorder which precluded her often from leaving her bed.  I shared with Rebecca Goodman the concerns of the medical team regarding her mother's prognosis.  We further discussed her septic shock, severe liver failure, kidney failure, and ongoing ventilatory support.    I shared that the likelihood her mother would be a functional member of society again was not great.  I asked Rebecca Goodman what she believes at this point in time would be important to her mother?  I asked her whether or not her mother would find the degree of debility  she may suffer and an inability to care for herself to be unacceptable quality of life.  Rebecca Goodman shares that her mother has been suffering for a long time and she does not want to see her mother suffer anymore.  A detailed discussion was had today regarding advanced directives.  Concepts  specific to code status, artifical feeding and hydration, continued IV antibiotics and rehospitalization was had.  Rebecca Goodman was made a DO NOT RESUSCITATE CODE STATUS.   The difference between a aggressive medical intervention path  and a palliative comfort care path for this patient at this time was had. We talked about transition to comfort measures in house and what that would entail inclusive of medications to control pain, dyspnea, agitation, nausea, itching, and hiccups.  We discussed  stopping all uneccessary measures such as, ventilatory support, blood draws, needle sticks, and frequent vital signs.   We discussed allowing family to come and spend time with her.  I set a time limit of 48 hours prior to care cessation.  Discussed the importance of continued conversation with family and their  medical providers regarding overall plan of care and treatment options, ensuring decisions are within the context of the patients values and GOCs. _____________________________________________________ Addendum: I met with patients mother, Rebecca Goodman at bedside this late morning.  I asked her if she had been updated on her daughter's prognosis by [facility]?  She shares that she is aware of what is going on and that totally a will not be with [place] much longer.  She is aware that Rebecca Goodman has taken a significant hit to multiple organ systems and that this is her time.  She also shares that her daughter was just the most wonderful person you could have her meet.  I asked how I may be of support to she and her family during this difficult time.  She shared that the family knows what has to be done and they no longer want to see Rebecca Goodman suffer.  There are multiple family members coming to see to Rebecca Goodman this afternoon after which the family will decide to liberate her from ventilation and maintain her comfort.  Per Rebecca Goodman she would like for this process to start sooner than later as she no longer wants to see her daughter in pain.  Decision Maker: Rebecca Goodman (daughter) [phone]  SUMMARY OF RECOMMENDATIONS   DNAR  Patient's family plans to come and spend time with her.  Afterwards they will let the medical team know to proceed with liberation from ventilatory support and initiate full comfort oriented care  Spiritual support requested patient is Rebecca Goodman: DNAR/DNI   Palliative Prophylaxis:   Oral care, turn every 2 hours, pain optimization  Additional  Recommendations (Limitations, Scope, Preferences):  Continue current scope of care until family is ready to transition to comfort    Psycho-social/Spiritual:   Desire for further Chaplaincy support:  Yes  Additional Recommendations:  Education on end-of-life care   Prognosis:  Exceptionally poor likely hours to days  Discharge Planning:  Discharge will be [facility]  PPS: 10%   This conversation/these recommendations were discussed with patient primary care team, Dr. Lake Bells  Time In: 0900 Time Out: 1030 Total Time: 90 Greater than 50%  of this time was spent counseling and coordinating care related to the above assessment and plan.  [place] Team Team Cell Phone: [phone] Please utilize secure chat with additional questions, if there is no response within 30 minutes please call the above phone number  Palliative Medicine Team providers are available by phone from 7am to 7pm daily and can be reached through the team cell phone.  Should this patient require assistance outside of these hours, please call the patient's attending physician.

## 2020-02-04 NOTE — Procedures (Signed)
Extubation Procedure Note  Patient Details:   Name: Rebecca Goodman DOB: Aug 07, 1975 MRN: [id number]   Airway Documentation:    Vent end date: 01/17/2020 Vent end time: 1305   Evaluation  O2 sats: currently acceptable Complications: No apparent complications Patient did tolerate procedure well. Bilateral Breath Sounds: Rhonchi, Diminished   Yes   PT terminallly extubated.  RN at bedside.  PT is a DNR.     Cain Sieve 01/12/2020, 1:12 PM

## 2020-02-04 DEATH — deceased
